# Patient Record
Sex: Female | Born: 1935 | Race: White | Hispanic: No | Smoking: Never smoker
Health system: Southern US, Community
[De-identification: ages and names within clinical notes are randomized; demographics above are authoritative.]

## PROBLEM LIST (undated history)

## (undated) DIAGNOSIS — G8929 Other chronic pain: Secondary | ICD-10-CM

## (undated) DIAGNOSIS — N390 Urinary tract infection, site not specified: Secondary | ICD-10-CM

## (undated) DIAGNOSIS — G4700 Insomnia, unspecified: Secondary | ICD-10-CM

## (undated) DIAGNOSIS — M8580 Other specified disorders of bone density and structure, unspecified site: Secondary | ICD-10-CM

## (undated) DIAGNOSIS — F0390 Unspecified dementia without behavioral disturbance: Secondary | ICD-10-CM

## (undated) DIAGNOSIS — I10 Essential (primary) hypertension: Secondary | ICD-10-CM

## (undated) DIAGNOSIS — G629 Polyneuropathy, unspecified: Secondary | ICD-10-CM

## (undated) DIAGNOSIS — E039 Hypothyroidism, unspecified: Secondary | ICD-10-CM

## (undated) HISTORY — PX: KNEE SURGERY: SHX244

## (undated) HISTORY — DX: Other specified disorders of bone density and structure, unspecified site: M85.80

## (undated) HISTORY — PX: TOTAL HIP ARTHROPLASTY: SHX124

## (undated) HISTORY — DX: Urinary tract infection, site not specified: N39.0

## (undated) HISTORY — DX: Insomnia, unspecified: G47.00

## (undated) HISTORY — DX: Hypothyroidism, unspecified: E03.9

## (undated) HISTORY — DX: Polyneuropathy, unspecified: G62.9

## (undated) HISTORY — PX: APPENDECTOMY: SHX54

## (undated) HISTORY — DX: Essential (primary) hypertension: I10

## (undated) HISTORY — DX: Unspecified dementia, unspecified severity, without behavioral disturbance, psychotic disturbance, mood disturbance, and anxiety: F03.90

---

## 1998-06-07 ENCOUNTER — Ambulatory Visit (HOSPITAL_COMMUNITY): Admission: RE | Admit: 1998-06-07 | Discharge: 1998-06-07 | Payer: Self-pay | Admitting: Obstetrics and Gynecology

## 1999-09-26 ENCOUNTER — Other Ambulatory Visit: Admission: RE | Admit: 1999-09-26 | Discharge: 1999-09-26 | Payer: Self-pay | Admitting: Obstetrics and Gynecology

## 1999-11-14 ENCOUNTER — Encounter: Payer: Self-pay | Admitting: Cardiology

## 1999-11-14 ENCOUNTER — Encounter: Admission: RE | Admit: 1999-11-14 | Discharge: 1999-11-14 | Payer: Self-pay | Admitting: Cardiology

## 1999-11-17 ENCOUNTER — Ambulatory Visit (HOSPITAL_COMMUNITY): Admission: RE | Admit: 1999-11-17 | Discharge: 1999-11-17 | Payer: Self-pay | Admitting: Cardiology

## 1999-11-17 ENCOUNTER — Encounter: Payer: Self-pay | Admitting: Cardiology

## 2000-09-30 ENCOUNTER — Other Ambulatory Visit: Admission: RE | Admit: 2000-09-30 | Discharge: 2000-09-30 | Payer: Self-pay | Admitting: Obstetrics and Gynecology

## 2001-05-23 ENCOUNTER — Ambulatory Visit (HOSPITAL_COMMUNITY): Admission: RE | Admit: 2001-05-23 | Discharge: 2001-05-23 | Payer: Self-pay | Admitting: Gastroenterology

## 2001-08-18 ENCOUNTER — Encounter: Payer: Self-pay | Admitting: Cardiology

## 2001-08-18 ENCOUNTER — Inpatient Hospital Stay (HOSPITAL_COMMUNITY): Admission: EM | Admit: 2001-08-18 | Discharge: 2001-08-21 | Payer: Self-pay | Admitting: Emergency Medicine

## 2001-08-20 ENCOUNTER — Encounter: Payer: Self-pay | Admitting: Cardiology

## 2001-10-08 ENCOUNTER — Other Ambulatory Visit: Admission: RE | Admit: 2001-10-08 | Discharge: 2001-10-08 | Payer: Self-pay | Admitting: Obstetrics and Gynecology

## 2002-10-02 ENCOUNTER — Ambulatory Visit (HOSPITAL_COMMUNITY): Admission: RE | Admit: 2002-10-02 | Discharge: 2002-10-02 | Payer: Self-pay | Admitting: Cardiology

## 2002-10-02 ENCOUNTER — Encounter: Payer: Self-pay | Admitting: Cardiology

## 2003-11-03 ENCOUNTER — Other Ambulatory Visit: Admission: RE | Admit: 2003-11-03 | Discharge: 2003-11-03 | Payer: Self-pay | Admitting: Gynecology

## 2005-01-29 ENCOUNTER — Encounter: Admission: RE | Admit: 2005-01-29 | Discharge: 2005-02-21 | Payer: Self-pay | Admitting: Family Medicine

## 2005-11-05 ENCOUNTER — Other Ambulatory Visit: Admission: RE | Admit: 2005-11-05 | Discharge: 2005-11-05 | Payer: Self-pay | Admitting: Gynecology

## 2010-02-24 ENCOUNTER — Inpatient Hospital Stay (HOSPITAL_COMMUNITY)
Admission: EM | Admit: 2010-02-24 | Discharge: 2010-03-01 | Payer: Self-pay | Source: Home / Self Care | Admitting: Emergency Medicine

## 2010-11-20 ENCOUNTER — Inpatient Hospital Stay (HOSPITAL_COMMUNITY)
Admission: EM | Admit: 2010-11-20 | Discharge: 2010-11-23 | DRG: 315 | Disposition: A | Discharge status: Home / Self Care | Payer: Medicare Other | Attending: Family Medicine | Admitting: Family Medicine

## 2010-11-20 DIAGNOSIS — I129 Hypertensive chronic kidney disease with stage 1 through stage 4 chronic kidney disease, or unspecified chronic kidney disease: Secondary | ICD-10-CM | POA: Diagnosis present

## 2010-11-20 DIAGNOSIS — N183 Chronic kidney disease, stage 3 unspecified: Secondary | ICD-10-CM | POA: Diagnosis present

## 2010-11-20 DIAGNOSIS — N179 Acute kidney failure, unspecified: Secondary | ICD-10-CM | POA: Diagnosis present

## 2010-11-20 DIAGNOSIS — Z87440 Personal history of urinary (tract) infections: Secondary | ICD-10-CM

## 2010-11-20 DIAGNOSIS — I472 Ventricular tachycardia, unspecified: Secondary | ICD-10-CM | POA: Diagnosis present

## 2010-11-20 DIAGNOSIS — Y92009 Unspecified place in unspecified non-institutional (private) residence as the place of occurrence of the external cause: Secondary | ICD-10-CM

## 2010-11-20 DIAGNOSIS — I498 Other specified cardiac arrhythmias: Secondary | ICD-10-CM | POA: Diagnosis present

## 2010-11-20 DIAGNOSIS — G589 Mononeuropathy, unspecified: Secondary | ICD-10-CM | POA: Diagnosis present

## 2010-11-20 DIAGNOSIS — I959 Hypotension, unspecified: Principal | ICD-10-CM | POA: Diagnosis present

## 2010-11-20 DIAGNOSIS — W19XXXA Unspecified fall, initial encounter: Secondary | ICD-10-CM | POA: Diagnosis present

## 2010-11-20 DIAGNOSIS — E039 Hypothyroidism, unspecified: Secondary | ICD-10-CM | POA: Diagnosis present

## 2010-11-20 DIAGNOSIS — I4729 Other ventricular tachycardia: Secondary | ICD-10-CM | POA: Diagnosis present

## 2010-11-20 DIAGNOSIS — Z96659 Presence of unspecified artificial knee joint: Secondary | ICD-10-CM

## 2010-11-20 DIAGNOSIS — T502X5A Adverse effect of carbonic-anhydrase inhibitors, benzothiadiazides and other diuretics, initial encounter: Secondary | ICD-10-CM | POA: Diagnosis present

## 2010-11-20 DIAGNOSIS — F0390 Unspecified dementia without behavioral disturbance: Secondary | ICD-10-CM | POA: Diagnosis present

## 2010-11-20 DIAGNOSIS — D72829 Elevated white blood cell count, unspecified: Secondary | ICD-10-CM | POA: Diagnosis present

## 2010-11-21 LAB — COMPREHENSIVE METABOLIC PANEL
Alkaline Phosphatase: 42 U/L (ref 39–117)
CO2: 28 mEq/L (ref 19–32)
Creatinine, Ser: 1.11 mg/dL (ref 0.4–1.2)
Glucose, Bld: 94 mg/dL (ref 70–99)
Potassium: 3.9 mEq/L (ref 3.5–5.1)
Total Bilirubin: 0.7 mg/dL (ref 0.3–1.2)

## 2010-11-21 LAB — CBC
HCT: 29.8 % — ABNORMAL LOW (ref 36.0–46.0)
Hemoglobin: 9.7 g/dL — ABNORMAL LOW (ref 12.0–15.0)
MCHC: 32.6 g/dL (ref 30.0–36.0)
Platelets: 238 10*3/uL (ref 150–400)
RBC: 3.32 MIL/uL — ABNORMAL LOW (ref 3.87–5.11)

## 2010-11-22 DIAGNOSIS — I471 Supraventricular tachycardia: Secondary | ICD-10-CM

## 2010-11-22 LAB — BASIC METABOLIC PANEL
BUN: 17 mg/dL (ref 6–23)
Chloride: 108 mEq/L (ref 96–112)
GFR calc Af Amer: >60 mL/min (ref >60)
GFR calc non Af Amer: 58 mL/min — ABNORMAL LOW (ref >60)
Potassium: 3.6 mEq/L (ref 3.5–5.1)
Sodium: 142 mEq/L (ref 135–145)

## 2010-11-22 LAB — CBC
MCV: 88.6 fL (ref 78.0–100.0)
Platelets: 236 10*3/uL (ref 150–400)
RBC: 3.41 MIL/uL — ABNORMAL LOW (ref 3.87–5.11)
RDW: 13 % (ref 11.5–15.5)
WBC: 8.1 10*3/uL (ref 4.0–10.5)

## 2010-11-22 LAB — MAGNESIUM: Magnesium: 2 mg/dL (ref 1.5–2.5)

## 2010-11-22 LAB — VITAMIN B12: Vitamin B-12: 300 pg/mL (ref 211–911)

## 2010-11-22 LAB — RPR: RPR Ser Ql: NONREACTIVE

## 2010-11-22 NOTE — H&P (Signed)
NAMEBONNETTA, ALLBEE              ACCOUNT NO.:  [id number]  MEDICAL RECORD NO.:  [id number]          PATIENT TYPE:  INP  LOCATION:  4729                         FACILITY:  [facility]  PHYSICIAN:  Hillery Aldo, M.D.   DATE OF BIRTH:  Oct 13, 1924  DATE OF ADMISSION:  11/20/2010 DATE OF DISCHARGE:                             HISTORY & PHYSICAL   PRIMARY CARE PHYSICIAN:  Ernestina Penna, MD  CHIEF COMPLAINT:  Frequent falling.  HISTORY OF PRESENT ILLNESS:  The patient is an 77 year old female with past medical history of dementia as well as bilateral foot neuropathy, who presents with having had several falls at home over the past 3 days. The patient had 2 falls today, prompting her nephew to call EMS.  The patient states that her falls are not preceded by any dizziness and there is no loss of consciousness.  She merely gets weak and falls on the floor.  The patient's nephew states that he noticed that she was started on a new blood pressure medicine this morning, although the blood pressure medicines are the same, she is no longer on a combination pill.  The patient denies any fever, chills, dyspnea, chest pain, or dysuria.  Upon initial evaluation in the emergency department, the patient was found to be hypotensive with a diastolic blood pressure of 30.  She subsequently has been referred to the hospitalist service for inpatient evaluation.  PAST MEDICAL HISTORY: 1. Hypertension. 2. Peripheral neuropathy of the lower extremities. 3. History of supraventricular tachycardia. 4. Vertigo. 5. Hypothyroidism. 6. Dementia. 7. Stage III chronic kidney disease with a baseline creatinine of     1.05.  PAST SURGICAL HISTORY: 1. Left total knee replacement. 2. Appendectomy. 3. Left hip fracture repair.  FAMILY HISTORY:  The patient's father died at a young age from unknown causes.  The patient's mother died at age 61.  She had a heart disease and cerebrovascular disease.  She has 1  sister who is alive and suffers with glaucoma, hypertension, and heart disease.  She has 2 healthy offspring.  SOCIAL HISTORY:  The patient is widowed and her nephew currently lives with her and helps her with her ADLs.  She is a lifelong nonsmoker.  She denies alcohol or drug use.  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  ALLERGIES:  No known drug allergies.  CURRENT MEDICATIONS: 1. Hydrochlorothiazide 25 mg p.o. daily. 2. Potassium chloride 20 mEq p.o. daily. 3. Synthroid 25 mcg p.o. daily. 4. Ambien 5 mg p.o. nightly p.r.n. sleep. 5. Atenolol 25 mg p.o. daily. 6. Benazepril 10 mg p.o. q.a.m. and 20 mg p.o. nightly. 7. APAP/codeine 300/30 one tablet p.o. q.6 h p.r.n. pain. 8. Gabapentin 900 mg p.o. nightly.  REVIEW OF SYSTEMS:  Comprehensive 14-point review of systems was completed and the pertinents are mentioned in the elements of the HPI above.  Pertinent negatives are no headache, no focal neurologic deficits.  Her appetite has been normal.  There has not been any weight loss. PHYSICAL EXAMINATION:  VITAL SIGNS:  Temperature 97.9, pulse 59, respirations 16, blood pressure 93/30, O2 saturation  95% on room air. GENERAL:  Elderly frail female who is in no acute distress. HEENT:  Normocephalic, atraumatic.  PERRL.  EOMI.  Oropharynx is clear. NECK:  Supple, no thyromegaly, no lymphadenopathy, no jugular venous distention. CHEST:  Lungs are clear to auscultation bilaterally with good air movement. HEART:  Regular rate, rhythm.  No murmurs, rubs, or gallops. ABDOMEN:  Soft, nontender, nondistended with normoactive bowel sounds. EXTREMITIES:  A 2+ edema bilaterally. SKIN:  Warm and dry.  No rashes. NEUROLOGIC:  The patient is alert and oriented x2.  Cranial nerves II through XII are grossly intact.  Nonfocal.  DATA REVIEW:  CT scan of the head shows atrophy and chronic microvascular ischemia, but no acute changes.  Chest  x-ray shows mild cardiac enlargement and mild chronic bronchitic- type changes, but no acute pulmonary findings.  LABORATORY DATA:  Urine drug screen is positive for opiates.  Lactic acid is 1.2.  Sodium is 138, potassium 4.2, chloride 100, bicarb 29, BUN 29, creatinine 1.25, glucose 89, calcium 8.7.  PTT is 38, PT 15.6. Alcohol is less than 5.  White blood cell count is 14.4, hemoglobin 12, hematocrit 37.6, platelets 280.  Urinalysis is negative for nitrites, rare bacteria, with 11-20 white blood cells, and many squamous epithelial cells.  ASSESSMENT AND PLAN: 1. Falls with weakness:  The patient's falls and weakness are likely     secondary to hypotension.  We will admit her, gently hydrate her,     and check orthostatics upon admission and q.a.m. x3 sets.  We will     hold her blood pressure medicines for any systolic blood pressure     less than 110 and completely discontinue hydrochlorothiazide at     this time. 2. History of urinary tract infections:  The patient is not currently     complaining of any dysuria, and although she has pyuria, her     nitrites were negative and there were many squamous cells in the     sample suggesting contamination.  At this point, would hold     antibiotics unless her urine cultures are positive. 3. Acute renal failure in setting of stage III chronic kidney disease:     The patient's baseline creatinine is 1.05 and her current     creatinine is approximately 20% higher indicating acute renal     failure.  We will gently hydrate the patient and hold her diuretic. 4. Bilateral foot neuropathy:  Likely contributory to the patient's     falling.  We will continue gabapentin and get a PT and OT     evaluation. 5. Hypothyroidism:  Check the patient's TSH.  We will continue her     usual dose of Synthroid. 6. Prophylaxis:  Initiate Lovenox for DVT prophylaxis.  Time spent on admission including face-to-face time equals approximately 1  hour.     Hillery Aldo, M.D.     CR/MEDQ  D:  11/20/2010  T:  11/21/2010  Job:  [id number]  cc:   Ernestina Penna, M.D.  Electronically Signed by Hillery Aldo M.D. on 11/22/2010 12:49:26 PM

## 2010-11-23 DIAGNOSIS — I472 Ventricular tachycardia: Secondary | ICD-10-CM

## 2010-11-23 LAB — FOLATE RBC: RBC Folate: 1015 ng/mL — ABNORMAL HIGH (ref 180–600)

## 2010-11-23 LAB — CBC
Platelets: 289 10*3/uL (ref 150–400)
RBC: 3.72 MIL/uL — ABNORMAL LOW (ref 3.87–5.11)
RDW: 12.6 % (ref 11.5–15.5)
WBC: 9.4 10*3/uL (ref 4.0–10.5)

## 2010-11-23 LAB — BASIC METABOLIC PANEL
BUN: 12 mg/dL (ref 6–23)
Chloride: 101 mEq/L (ref 96–112)
GFR calc Af Amer: >60 mL/min (ref >60)
GFR calc non Af Amer: >60 mL/min (ref >60)
Potassium: 3.5 mEq/L (ref 3.5–5.1)
Sodium: 137 mEq/L (ref 135–145)

## 2010-12-04 NOTE — Discharge Summary (Signed)
Casey Vasquez, Casey Vasquez              ACCOUNT NO.:  [id number]  MEDICAL RECORD NO.:  [id number]           PATIENT TYPE:  I  LOCATION:  4734                         FACILITY:  [facility]  PHYSICIAN:  Mauro Kaufmann, MD         DATE OF BIRTH:  11-17-1923  DATE OF ADMISSION:  11/20/2010 DATE OF DISCHARGE:  11/23/2010                              DISCHARGE SUMMARY   ADMISSION DIAGNOSES: 1. Fall with weakness. 2. History of urinary tract infections. 3. Acute renal failure. 4. Bilateral foot neuropathy. 5. Hypothyroidism.  DISCHARGE DIAGNOSES: 1. Fall secondary to hypotension with diuretics, off diuretics at this     time. 2. Acute renal failure resolved. 3. Neuropathy. 4. Hypothyroidism.  TESTS PERFORMED DURING THE HOSPITAL STAY:  Chest x-ray on November 20, 2010, showed mild cardiac enlargement, mild chronic bronchitic changes, but no acute pulmonary findings.  CT head without contrast on November 20, 2010, showed atrophy and chronic microvascular ischemia.  No acute intracranial abnormality.  PERTINENT LABORATORY DATA:  Magnesium level is 2.0.  RPR nonreactive. Urine culture showed no growth.  TSH 2.115, T4 1.11.  Urine culture showed no growth.  Vitamin B12 was 300.  BRIEF HISTORY AND PHYSICAL:  An 77 year old Caucasian female with no known cardiac history who underwent cardiac cath in 2001 with normal coronaries, normal left ventricular ejection fraction, has a history of hypothyroidism, dementia, vertigo, who came to the hospital with several falls at home over the past 3 days.  The patient states that falls were not preceded with any dizziness, and there was no loss of consciousness, and the patient was admitted with diagnosis of fall with weakness, and she was found to be hypotensive, and the hydrochlorothiazide was stopped at the time of admission.  BRIEF HOSPITAL COURSE: 1. Fall with weakness.  The patient was thought to be having the falls     due to the diuretics.  She is off  diuretics at this time.  Also,     Cardiology saw the patient in the hospital and as said above, a 48-     hour Holter monitoring as outpatient at the time of discharge.  The     patient also will be getting home health, PT, OT for strengthening     at home. 2. Acute renal failure.  The patient at the time of admission was     acute renal failure with her creatinine was 1.25 at this time.  The     patient has improved as BUN and creatinine is down to 12.12/0.86. 3. Leukocytosis, which is resolved at this time.  This could be due to     stress, but at this time the patient has been afebrile in the     hospital and with negative urine cultures.  On the day of     discharge, the patient with WBC 9.4, hemoglobin 10.7, hematocrit     32.8.  Her vital signs on the day of discharge temperature 98,     pulse 50, respirations 18, blood pressure 136/74, O2 sat 96% on     room air. 4. Hypothyroidism.  The patient will  continue to take Synthroid. 5. Nonsustained V-tach.  The patient had 7 beats of nonsustained V-     tach in the hospital, and she also has bradycardia.  Cardiology has     seen in consultation.  The patient does have a history of     supraventricular tachycardia in the past.  The patient will get     outpatient 48-hour Holter monitoring as per Cardiology as     outpatient.  At this time, the patient is stable for discharge. 6. Hypertension.  The patient's blood pressure is stable.  She is off     the diuretics, and we have started the patient back on benazepril     at a lower dose, and she will continue on atenolol.  MEDICATIONS ON DISCHARGE: 1. Tylenol 650 p.o. 4 hours as needed. 2. Atenolol 25 mg p.o. daily. 3. Benazepril 20 mg p.o. daily at bedtime. 4. Gabapentin 300 mg 3 capsules daily at bedtime. 5. Levothyroxine 25 mcg 1 tablet p.o. daily. 6. Zolpidem 5 mg 1 p.o. daily at bedtime.     Mauro Kaufmann, MD     GL/MEDQ  D:  11/23/2010  T:  11/24/2010  Job:  [id number]  cc:    Ernestina Penna, M.D. Rollene Rotunda, MD, [facility]  Electronically Signed by Mauro Kaufmann  on 12/04/2010 10:38:11 AM

## 2011-01-04 NOTE — Consult Note (Signed)
Casey Vasquez, MESSENGER NO.:  [id number]  MEDICAL RECORD NO.:  [id number]           PATIENT TYPE:  LOCATION:                                 FACILITY:  PHYSICIAN:  Rollene Rotunda, MD, FACCDATE OF BIRTH:  11/15/1923  DATE OF CONSULTATION:  11/22/2010 DATE OF DISCHARGE:                                CONSULTATION   PRIMARY CARE PHYSICIAN:  Ernestina Penna, MD  She is new to Cardiology.  REASON FOR CONSULTATION:  Seven beats of nonsustained ventricular tachycardia.  HISTORY OF PRESENT ILLNESS:  Ms. Casey Vasquez is an 77 year old Caucasian female with no known cardiac history having undergone cardiac catheterization in 2001 with normal coronary arteries and normal left ventricular ejection fraction of 60%, history significant for hypertension (although hypotensive on admission), hypothyroidism, vertigo, dementia, and history of SVT who presents with seven beats of nonsustained ventricular tachycardia in the setting of an admission for falls, found to be significantly hypotensive on admission with blood pressure of 83/36 in the emergency department.  The patient was in her usual state of health until recently but is not exactly sure when her symptoms started, but she was complaining of weakness and had some falls at home where she lives with her nephew. The patient denies pre/syncope, palpitations, chest pain, shortness of breath, nausea, vomiting, or any other complaints other than weakness. She does seem a bit confused and it is unclear whether or not she can give an accurate history, but she is adamant that she did not have any chest pain, shortness of breath, pre/syncope, or palpitations.  She does report falls, but she is unsure of exactly when they started or their character.  For example, when asked to describe her falls, she states she simply falls and she cannot differentiate between a fast fall that she has no control over versus a slow fall where she is  bracing herself. Again, the patient is sure she did not lose consciousness.  At any rate, she lives with her nephew who became concerned about her and brought her to the emergency department, where initial blood pressure reading was 83/36.  She was subsequently admitted by the hospitalist service and treated with IV fluids.  She has had orthostatics checked twice, both sets were negative.  On telemetry, she is normally in sinus bradycardia with rates in the high 40s to 50s and she had one run of seven beats of nonsustained VT followed by either junctional or sinus beat then followed by a different morphology of likely ventricular origin.  She has not had any other runs of NSVT.  Nor does she have frequent ventricular ectopy on telemetry.  Labs unremarkable including TSH within normal limits, electrolytes within normal limits, and potassium 3.6-4.2, and magnesium of 2.0.  She has mild anemia but no other significant findings.  Chest x-ray and CT of the head showed some chronic changes but nothing acute.  EKG shows sinus bradycardia at a rate of 58 bpm but no other abnormalities.  When compared to prior tracing, no changes.  Currently, the patient is asymptomatic other than minimal chest discomfort that has been nagging her all day  that she is unconcerned about.  She denies any association with exertion or shortness of breath. She is great looking forward to going home which she anticipates to be tomorrow.  Of note, the patient is on antihypertensive therapy including atenolol 25 mg daily, Lotensin 20 mg daily, and at home was on chlorthalidone 12.5 mg p.o. daily which was stopped prior to admission and she was treated with IV fluids.  Since then, her blood pressure has come up and systolic has been anywhere from 113-174.  PAST MEDICAL HISTORY: 1. Hypertension (hypotensive on admission, 83/36). 2. Hypothyroidism. 3. Vertigo. 4. CKD, stage III. 5. Dementia. 6. History of SVT. 7.  Peripheral neuropathy.  PAST SURGICAL HISTORY: 1. S/P total left knee arthroplasty. 2. ORIF left hip. 3. Appendectomy.  SOCIAL HISTORY:  [place]  She lives with her nephew on what she describes as a farm. She is retired.  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  She has never been a smoker nor does she use any alcohol or illicit drugs.  No herbal meds.  She has a regular diet, is active and completing most of her own daily activities, but no regular exercise and must pace herself in regard to those daily activities.  FAMILY HISTORY:  Noncontributory secondary to the patient's advanced age.  REVIEW OF SYSTEMS:  Please see HPI.  All other systems reviewed and were negative.  CODE STATUS:  Full.  ALLERGIES:  No known drug allergies.  MEDICATIONS: 1. Atenolol 25 mg p.o. daily. 2. Lotensin 20 mg p.o. daily. 3. Lovenox 40 mg subcu injection daily. 4. Gabapentin 900 mg p.o. nightly. 5. Synthroid 25 mcg p.o. daily. 6. MiraLax. 7. P.r.n. medications.  Home Medications:  At home, the patient was on above along with chlorthalidone 12.5 mg p.o. daily and APAP with codeine.  PHYSICAL EXAMINATION:  VITAL SIGNS:  Temperature 97.3 degrees Fahrenheit with BP ranging from 113-174 over 69-  82, pulse ranging from high 40s to 60s, respiration rate 16-18, O2 saturation 98% on room air. TELEMETRY:  Sinus bradycardia in the 40s to 50s along with normal sinus rhythm in the 60s with seven beats of NSVT, very infrequent ventricular ectopy. ORTHOSTATICS:  Negative x2. GENERAL:  The patient is alert and oriented x1 (person), in no apparent distress, able to speak very easily in full sentences as well as move during exam without any respiratory distress. HEENT:  Her head is normocephalic, atraumatic.  Pupils equal, round, and reactive to light.  Extraocular muscles are intact.  Nares are patent without discharge.  Oropharynx without  erythema or exudate. NECK:  Supple without lymphadenopathy.  No thyromegaly, no JVD.  No bruits. HEART:  Rate is regular and brady in the 50s with audible S1 and S2.  A 1/6 systolic murmur best heard at the left upper sternal border.  Pulses are 2+ in both upper and lower extremities bilaterally. LUNGS:  Mild crackles half way up, otherwise, no significant findings. SKIN:  No rashes, lesions, or petechiae. ABDOMEN:  Soft, nontender, nondistended.  Normal abdominal bowel sounds. No rebound or guarding.  No hepatosplenomegaly. EXTREMITIES:  No clubbing or cyanosis.  Trace edema most bilaterally. MUSCULOSKELETAL:  Without joint deformity or effusions.  No spinal or CVA tenderness. NEUROLOGIC:  Cranial nerves II through XII grossly intact.  Strength 5/5 in all extremities and axial groups.  Normal sensation throughout and normal cerebellar function.  RADIOLOGY:  Chest x-ray showed mild cardiac enlargement with mild bronchitic changes. CT of  the head showed atrophy and chronic microvascular ischemia.  No acute findings. EKG; rhythm is sinus bradycardia at a rate of 58, no significant ST-T wave changes but early R-wave progression, no significant Q-waves, normal axis, no evidence of hypertrophy, PR 160, QRS 86, and QTC 420. When compared to a prior tracing from May 2011, there are no significant changes.  LABORATORY DATA:  WBC is 8.1, HGB 9.9, HCT 30.2, PLT count is 236. Sodium 142; potassium 3.6, down from 4.2 on admission; chloride 108; bicarb 26; BUN 17; creatinine 0.92; glucose 87.  Liver function tests within normal limits.  TSH within normal limits at 1.11, also T4 within normal limits.  Magnesium 2.0.  Urinalysis; hazy with many squamous epithelial cells and a small amount of leukocytes.  The culture is negative.  RPR nonreactive.  ASSESSMENT AND PLAN:  The patient was initially seen by Jarrett Ables, PA-C, and then seen and thoroughly assessed by attending cardiologist, Dr.  Rollene Rotunda.  Ms. Rollyson is an 77 year old Caucasian female with no known history of coronary artery disease with a normal cardiac catheterization in 2001, history significant for hypertension (hypotensive, 83/36, on admission), hypothyroidism, vertigo, dementia, history of supraventricular tachycardia who now presents with a seven-beat run of nonsustained ventricular tachycardia in the setting of admission for falls and subsequently noted to be hypotensive as above denying any pre/syncope, chest pain, shortness of breath, and no evidence of ischemia identified thus far.  The patient had one episode of wide-complex tachycardia.  No associated symptoms.  She has also had some bradycardia, again no associated symptoms.  She presented with falls but no syncope.  She does not convey any details in regard to the events leading to her evaluation in the emergency department.  She does have chest pain but cannot qualify or quantify.  Wide-complex tachycardia - one episode.  No symptoms.  I will order an outpatient 48-hour Holter monitor.  No other workup at the present time.  Thank you for the consult.     Jarrett Ables, PAC   ______________________________ Rollene Rotunda, MD, [facility]    MS/MEDQ  D:  11/22/2010  T:  11/23/2010  Job:  [id number]  Electronically Signed by Jarrett Ables PAC on 11/29/2010 03:03:09 PM Electronically Signed by Rollene Rotunda MD [facility] on 01/04/2011 09:52:05 AM

## 2011-01-09 LAB — DIFFERENTIAL
Basophils Absolute: 0 10*3/uL (ref 0.0–0.1)
Eosinophils Relative: 0 % (ref 0–5)
Eosinophils Relative: 0 % (ref 0–5)
Lymphocytes Relative: 13 % (ref 12–46)
Lymphocytes Relative: 7 % — ABNORMAL LOW (ref 12–46)
Lymphocytes Relative: 8 % — ABNORMAL LOW (ref 12–46)
Lymphs Abs: 1.9 10*3/uL (ref 0.7–4.0)
Monocytes Absolute: 1.6 10*3/uL — ABNORMAL HIGH (ref 0.1–1.0)
Monocytes Relative: 4 % (ref 3–12)
Monocytes Relative: 5 % (ref 3–12)
Monocytes Relative: 5 % (ref 3–12)
Neutro Abs: 12.3 10*3/uL — ABNORMAL HIGH (ref 1.7–7.7)
Neutrophils Relative %: 82 % — ABNORMAL HIGH (ref 43–77)
Neutrophils Relative %: 87 % — ABNORMAL HIGH (ref 43–77)

## 2011-01-09 LAB — URINE MICROSCOPIC-ADD ON

## 2011-01-09 LAB — PROTIME-INR
INR: 1.35 (ref 0.00–1.49)
INR: 1.44 (ref 0.00–1.49)
INR: 1.65 — ABNORMAL HIGH (ref 0.00–1.49)
INR: 1.89 — ABNORMAL HIGH (ref 0.00–1.49)
INR: 2.3 — ABNORMAL HIGH (ref 0.00–1.49)
Prothrombin Time: 16.6 seconds — ABNORMAL HIGH (ref 11.6–15.2)
Prothrombin Time: 25.1 seconds — ABNORMAL HIGH (ref 11.6–15.2)

## 2011-01-09 LAB — PREPARE RBC (CROSSMATCH)

## 2011-01-09 LAB — COMPREHENSIVE METABOLIC PANEL
ALT: 36 U/L — ABNORMAL HIGH (ref 0–35)
AST: 78 U/L — ABNORMAL HIGH (ref 0–37)
Calcium: 8.7 mg/dL (ref 8.4–10.5)
Creatinine, Ser: 2.06 mg/dL — ABNORMAL HIGH (ref 0.4–1.2)
GFR calc Af Amer: 28 mL/min — ABNORMAL LOW (ref >60)
Sodium: 141 mEq/L (ref 135–145)
Total Protein: 6 g/dL (ref 6.0–8.3)

## 2011-01-09 LAB — URINALYSIS, ROUTINE W REFLEX MICROSCOPIC
Nitrite: NEGATIVE
Nitrite: NEGATIVE
Specific Gravity, Urine: 1.02 (ref 1.005–1.030)
Specific Gravity, Urine: 1.021 (ref 1.005–1.030)
Urobilinogen, UA: 0.2 mg/dL (ref 0.0–1.0)
Urobilinogen, UA: 1 mg/dL (ref 0.0–1.0)
pH: 5.5 (ref 5.0–8.0)

## 2011-01-09 LAB — BASIC METABOLIC PANEL
BUN: 48 mg/dL — ABNORMAL HIGH (ref 6–23)
BUN: 50 mg/dL — ABNORMAL HIGH (ref 6–23)
CO2: 26 mEq/L (ref 19–32)
CO2: 27 mEq/L (ref 19–32)
Calcium: 7.3 mg/dL — ABNORMAL LOW (ref 8.4–10.5)
Calcium: 8.1 mg/dL — ABNORMAL LOW (ref 8.4–10.5)
Chloride: 107 mEq/L (ref 96–112)
Chloride: 107 mEq/L (ref 96–112)
Creatinine, Ser: 1.81 mg/dL — ABNORMAL HIGH (ref 0.4–1.2)
Creatinine, Ser: 2.01 mg/dL — ABNORMAL HIGH (ref 0.4–1.2)
GFR calc Af Amer: 29 mL/min — ABNORMAL LOW (ref >60)
GFR calc Af Amer: 32 mL/min — ABNORMAL LOW (ref >60)
GFR calc Af Amer: >60 mL/min (ref >60)
GFR calc non Af Amer: 27 mL/min — ABNORMAL LOW (ref >60)
GFR calc non Af Amer: 36 mL/min — ABNORMAL LOW (ref >60)
Potassium: 3.3 mEq/L — ABNORMAL LOW (ref 3.5–5.1)
Potassium: 4.3 mEq/L (ref 3.5–5.1)
Sodium: 142 mEq/L (ref 135–145)
Sodium: 142 mEq/L (ref 135–145)

## 2011-01-09 LAB — TSH: TSH: 2.785 u[IU]/mL (ref 0.350–4.500)

## 2011-01-09 LAB — TYPE AND SCREEN
ABO/RH(D): O NEG
Antibody Screen: NEGATIVE

## 2011-01-09 LAB — CBC
HCT: 28.6 % — ABNORMAL LOW (ref 36.0–46.0)
HCT: 30.3 % — ABNORMAL LOW (ref 36.0–46.0)
HCT: 32.5 % — ABNORMAL LOW (ref 36.0–46.0)
Hemoglobin: 10.1 g/dL — ABNORMAL LOW (ref 12.0–15.0)
Hemoglobin: 10.5 g/dL — ABNORMAL LOW (ref 12.0–15.0)
Hemoglobin: 11.4 g/dL — ABNORMAL LOW (ref 12.0–15.0)
MCHC: 34.3 g/dL (ref 30.0–36.0)
MCHC: 34.9 g/dL (ref 30.0–36.0)
MCHC: 34.9 g/dL (ref 30.0–36.0)
MCV: 88.8 fL (ref 78.0–100.0)
Platelets: 146 10*3/uL — ABNORMAL LOW (ref 150–400)
RBC: 2.81 MIL/uL — ABNORMAL LOW (ref 3.87–5.11)
RBC: 3.28 MIL/uL — ABNORMAL LOW (ref 3.87–5.11)
RBC: 3.42 MIL/uL — ABNORMAL LOW (ref 3.87–5.11)
RBC: 3.7 MIL/uL — ABNORMAL LOW (ref 3.87–5.11)
RDW: 13.6 % (ref 11.5–15.5)
WBC: 10 10*3/uL (ref 4.0–10.5)
WBC: 10.6 10*3/uL — ABNORMAL HIGH (ref 4.0–10.5)
WBC: 14.9 10*3/uL — ABNORMAL HIGH (ref 4.0–10.5)

## 2011-01-09 LAB — CK TOTAL AND CKMB (NOT AT ARMC)
CK, MB: 73.3 ng/mL (ref 0.3–4.0)
Relative Index: 2.7 — ABNORMAL HIGH (ref 0.0–2.5)

## 2011-01-09 LAB — URINE CULTURE: Colony Count: NO GROWTH

## 2011-01-09 LAB — LACTIC ACID, PLASMA: Lactic Acid, Venous: 2.1 mmol/L (ref 0.5–2.2)

## 2011-01-10 LAB — BASIC METABOLIC PANEL
BUN: 29 mg/dL — ABNORMAL HIGH (ref 6–23)
CO2: 29 mEq/L (ref 19–32)
Creatinine, Ser: 1.25 mg/dL — ABNORMAL HIGH (ref 0.4–1.2)
GFR calc Af Amer: 49 mL/min — ABNORMAL LOW (ref >60)
GFR calc non Af Amer: 41 mL/min — ABNORMAL LOW (ref >60)
Glucose, Bld: 89 mg/dL (ref 70–99)
Potassium: 4.2 mEq/L (ref 3.5–5.1)

## 2011-01-10 LAB — ETHANOL: Alcohol, Ethyl (B): <5 mg/dL (ref 0–10)

## 2011-01-10 LAB — URINE MICROSCOPIC-ADD ON

## 2011-01-10 LAB — CBC
HCT: 37.6 % (ref 36.0–46.0)
MCHC: 31.9 g/dL (ref 30.0–36.0)
Platelets: 280 10*3/uL (ref 150–400)

## 2011-01-10 LAB — URINALYSIS, ROUTINE W REFLEX MICROSCOPIC
Ketones, ur: NEGATIVE mg/dL
Nitrite: NEGATIVE
Specific Gravity, Urine: 1.016 (ref 1.005–1.030)
Urobilinogen, UA: 0.2 mg/dL (ref 0.0–1.0)

## 2011-01-10 LAB — PROTIME-INR: Prothrombin Time: 15.6 seconds — ABNORMAL HIGH (ref 11.6–15.2)

## 2011-01-10 LAB — DIFFERENTIAL
Lymphs Abs: 2.7 10*3/uL (ref 0.7–4.0)
Monocytes Absolute: 0.8 10*3/uL (ref 0.1–1.0)
Monocytes Relative: 5 % (ref 3–12)
Neutro Abs: 10.5 10*3/uL — ABNORMAL HIGH (ref 1.7–7.7)

## 2011-01-10 LAB — RAPID URINE DRUG SCREEN, HOSP PERFORMED
Benzodiazepines: NOT DETECTED
Cocaine: NOT DETECTED
Opiates: POSITIVE — AB
Tetrahydrocannabinol: NOT DETECTED

## 2011-01-10 LAB — URINE CULTURE: Colony Count: NO GROWTH

## 2011-01-10 LAB — APTT: aPTT: 38 seconds — ABNORMAL HIGH (ref 24–37)

## 2011-03-09 NOTE — Procedures (Signed)
[facility]  Patient:    Casey Vasquez, Casey Vasquez                     MRN: [id number] Proc. Date: 05/23/01 Adm. Date:  [id number] Disc. Date: [id number] Attending:  Nelda Marseille CC:         Gaynelle Cage, M.D., [place], [place]   Procedure Report  PROCEDURE:  Colonoscopy.  INDICATIONS:  Some lower abdominal discomfort in a patient due for colonic screening.  INFORMED CONSENT:  Consent was signed after risk, benefits, methods and options were thoroughly discussed in the office.  MEDICINES USED:  Demerol 50 mg, Versed 5 mg.  DESCRIPTION OF PROCEDURE:  Rectal inspection was pertinent for external hemorrhoids.  Digital exam was negative.  The video pediatric colonoscope was inserted and easily advanced around the colon to the cecum.  This did require some abdominal pressure but no position changes.  The cecum was identified by the appendiceal orifice and the ileocecal valve.  No obvious abnormality was seen on insertion.  The scope was slowly withdrawn.  Prep was adequate.  There was some liquid stool that required washing and suctioning but on slow withdrawal through the colon, other than some occasional left-sided diverticula, no abnormalities were seen.  Once back in the rectum the scope was retroflexed, pertinent for  some internal hemorrhoids.  The scope was straightened and readvanced a short ways up the sigmoid, air was suctioned and the scope removed.  The patient tolerated the procedure well, and there was no obvious immediate complication.  ENDOSCOPIC DIAGNOSES: 1. External and internal hemorrhoids. 2. Occasional left-sided diverticula. 3. Otherwise within normal limits to the cecum.  PLAN:  Follow up p.r.n. or in two months to recheck symptoms and make sure no further work-up plans are needed.  Repeat colonic screening in 5-10 years pending symptoms, other medical problems, etc. DD:  05/23/01 TD:  05/25/01 Job: [id number]

## 2011-03-09 NOTE — Cardiovascular Report (Signed)
[facility]  Patient:    Casey Vasquez                      MRN: [id number] Proc. Date: 11/17/99 Adm. Date:  [id number] Disc. Date: [id number] Attending:  Ophelia Vasquez CC:         Casey Vasquez, M.D.             Cardiac Catheterization Lab                        Cardiac Catheterization  PROCEDURES PERFORMED: 1. Selective coronary angiography by Judkins technique. 2. Retrograde left heart catheterization. 3. Left ventricular angiography.  COMPLICATIONS:  None.  ENTRY SITE:  Right femoral.  DYE USED:  Omnipaque.  MEDICATIONS GIVEN:  Fentanyl 25 mg IV for sedation, given twice.  PATIENT PROFILE:  The patient is a 77 year old obese female who is followed by r. Casey Vasquez in [place] who has a history of chest pain in the past and has recently had "misery" in her chest that feels like a pressure, and at times, she has some fast heartbeats.  They do not occur with exertion.  They may occur with rest. he has had them in the middle of the night when she gets up to go to the bathroom.  She is treated for hypertension and menopausal symptoms.  This cardiac catheterization was performed on an outpatient basis electively.  RESULTS:  Pressures:  The left ventricular pressure was 180/20.  Central aortic pressure 180/85.  Angiographic results: 1. The left main coronary artery was normal. 2. The left anterior descending coronary artery courses to the cardiac apex. It    gives rise to a small first diagonal and a larger second diagonal branch. The    LAD and both diagonals are free of any significant atherosclerotic narrowing. 3. The left circumflex coronary artery is nondominant.  The circumflex and an    obtuse marginal branch appear normal. 4. The large and dominant right coronary artery gives off a long atrial circumflex    branch distally.  No lesions are seen in the RCA or any of its branches.  The left ventricle  contracts normally.  Ejection fraction is 60%.  There is catheter-induced mitral regurgitation seen.  During the catheterization, the patient developed a transient episode of atrial  fibrillation lasting approximately 45 seconds, and then it spontaneously resolved.  FINAL DIAGNOSES: 1. Angiographically patent coronary arteries. 2. Normal left ventricular function. 3. A 45-second run of atrial fibrillation during cardiac catheterization, resolving    spontaneously.  PLAN:  Proton pump inhibitors will be initiated, and a GI consultation will be obtained to see if the patient has gastrointestinal pathology to explain her symptoms.  A Holter monitor is planned, and if the patient continues to have palpitation, PAF may be the explanation for it and will require treatment. DD:  11/17/99 TD:  11/18/99 Job: [id number]

## 2011-03-09 NOTE — Discharge Summary (Signed)
[place]  Patient:    Casey Vasquez, Casey Vasquez Visit Number: [id number] MRN: [id number]          Service Type: MED Location: [id number] Attending Physician:  Ophelia Shoulder Dictated by:   Darcella Gasman. Ingold, F.N.P.C. Admit Date:  08/18/2001 Discharge Date: 08/21/2001   CC:         Dr. Vernon Prey, [place], [place]  Santina Evans A. Orlin Hilding, M.D.   Discharge Summary  DISCHARGE DIAGNOSES: 1. Supraventricular tachycardia, resolved. 2. Bradycardia, improved. 3. Hypertension, improved. 4. Significant dizziness. 5. Hyperlipidemia. 6. Cardiac catheterization January 2001, with normal coronary arteries and    normal ejection fraction.  DISCHARGE CONDITION:  Improved.  PROCEDURES:  None.  CONSULTATIONS: 1. Candy Sledge, M.D. 2. Deanna Artis. Hickling, M.D.  DISCHARGE MEDICATIONS:  1. K-Dur 20 mEq one half tablet daily.  2. Tenoretic 50/25 one half tablet daily.  3. Neurontin 300 mg two daily.  4. Ogen 0.625 one daily.  5. Lotensin 10 mg every morning.  6. Lotensin 20 mg every evening, two of the 10 mg tablets.  7. Claritin 10 mg daily.  8. Provera take on days 1 through 10.  9. Librax 50 mg one daily as needed. 10. Pravachol 20 mg one every evening.  DISCHARGE INSTRUCTIONS: 1. Activity as tolerated.  May use walker or four-point cane if needed. 2. Low fat, low salt diet. 3. Follow-up with [name], M.D., in [place], [place],    September 11, 2001, at 11 a.m.  HISTORY OF PRESENT ILLNESS:  On August 18, 2001, Ms. Casey Vasquez called complaining of lightheadedness.  She initially felt very weak on the telephone, went to lie down and then her heart began beating rapidly.  A neighbor was called. Blood pressure was 160/110.  Heart rate was too rapid to count. She was nauseated.  No shortness of breath or diaphoresis or chest pain. She did have left arm weakness.  EMS was called.  Heart rate was SVT in the 160s.  She was given  adenosine 6 mg IV and converted to sinus rhythm.  She continued to have dizziness, lightheaded feeling in her head despite being in sinus rhythm. Left arm weakness had resolved.  She stated she had been significantly weak and dizzy for one week.  She says occasionally her heart rate goes up but does not happen often.  PAST MEDICAL HISTORY: 1. Cardiac:  She had a normal coronary artery catheterization in January 2001    with normal EF. 2. She has a history of dizziness. 3. History of hypertension. 4. Recent work-up at [facility], for feet numbness, pain    in the lower legs, most likely neuropathy. 5. History of left total knee. 6. History of syncope 15 years ago, told it was a seizure but she weakness    never given seizure medications.  ALLERGIES:  PENICILLIN causes rash.  CELEBREX she does not tolerate.  OUTPATIENT MEDICATIONS: 1. Provera on days one through 10. 2. Tenoretic 50/25 one daily. 3. K-Dur 20 mEq daily. 4. Lotensin 10 daily. 5. Librax 50 one daily and p.r.n. 6. Extra Strength Tylenol p.r.n. 7. Ortho-Est one daily 0.625. 8. Neurontin  300 mg two at bedtime.  For family history, social history, review of systems see H&P.  DISCHARGE PHYSICAL EXAMINATION:  GENERAL APPEARANCE:  An alert and oriented white female who complains of dizziness.  VITAL SIGNS:  Blood pressure 139/57, pulse 57, respiratory rate 20, temperature 98.8.  Room air oxygen saturation  98%.  NECK:  Supple, no carotid bruits.  LUNGS:  Clear without wheezing, rhonchi or rales.  CARDIOVASCULAR:  S1 and S2, regular rate and rhythm.  No murmurs noted.  ABDOMEN:  Soft and nontender, positive bowel sounds.  EXTREMITIES:  Lower extremities without edema.  NEUROLOGICAL:  Nonfocal per Dr. Elsie Lincoln.  LABORATORY DATA:  Initial labs revealed hemoglobin 13.5, hematocrit 39.6, wbc 7.9, platelets 298; neutrophils 66, lymphs 25, monos 7, eosinophils 3, baso 1, sed rate 11.  Protime 14.2, INR  1.1, PTT 33.  Sodium 144, potassium 3.8, chloride 108, CO2 30, glucose 112, BUN 19, creatinine 1, calcium 9.2, total protein 5.7, albumin 3.3.  Potassium did drop to a low of 3.3 but by the date of discharge she was 3.8.  ALT 13, AST 20, total bilirubin 0.7, magnesium 2.1. Cardiac enzymes were done x 3 and all negative.  No myocardial infarction. Lipid panel showed total cholesterol 196, triglycerides 147, HDL 45, and LDL 122.  TSH 2.448.  UA was clear.  RADIOLOGY:  Portable chest x-ray on admission showed heart size is prominent, no active disease.  Follow-up CT of the head was done for left arm weakness.  A small amount of air in each cavernous sinus, could be due to air introduced recent IV access. Questionable right and left shunt within the heart or lungs.  There was concern of base of skull fracture. The patient had no injury to her head and no complaints of head pain.  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  No intercranial hemorrhage or mass effect.  Small amount of fluid in the inferior aspect of the sphenoid sinus on the right.  Questionable acute sinusitis.  Sinus films were done which showed no sinusitis.  A neurologic consult was obtained secondary to dizziness.  Carotid Dopplers were done which revealed mild noncalcific plaque throughout bilaterally.  No ICA stenosis bilaterally.  Vertebral artery flow was antegrade bilaterally.  Dr. Sharene Skeans saw the patient at discharge and felt cane or walker may be needed for improvement of mobility and he suspected vertebral arterial insufficiency.  HOSPITAL COURSE:  [name] was admitted by Delrae Rend, M.D., on call for Dr. Elsie Lincoln, to telemetry unit.  Studies done as stated. She was already in a sinus rhythm. She did become bradycardic in the 40s frequently.  Her Tenoretic  was decreased by half. She continued with hypertension, uncontrolled.  Her Lotensin was gradually increased to 20 mg twice a day.   Unfortunately, Dr. Sharene Skeans, with her vertebral arterial flow of insufficiency, recommended goal blood pressure 130/80 and no less than that.  We have cut back a little at discharge on her Lotensin to give her a little bump but she will need to be monitored.  She may be a patient that would do well with an outpatient blood pressure monitor.  Additionally, the family is quite concerned about Mrs. Lichtenwalner taking care of her husband at home.  Wanted home health.  Case manager did discuss that with them and instructed them to have the husbands primary physician arrange.  By August 21, 2001, the patient was ready for discharge.  She will follow up with Dr. Elsie Lincoln. Dictated by:   Darcella Gasman Ingold, F.N.P.C. Attending Physician:  Ophelia Shoulder DD:  08/21/01 TD:  08/22/01 Job: [id number]

## 2011-04-04 ENCOUNTER — Encounter: Payer: Medicare Other | Admitting: Cardiology

## 2011-05-01 ENCOUNTER — Encounter: Payer: Self-pay | Admitting: Cardiology

## 2011-05-02 ENCOUNTER — Ambulatory Visit (INDEPENDENT_AMBULATORY_CARE_PROVIDER_SITE_OTHER): Payer: Medicare Other | Admitting: Cardiology

## 2011-05-02 ENCOUNTER — Encounter: Payer: Self-pay | Admitting: Cardiology

## 2011-05-02 DIAGNOSIS — I4949 Other premature depolarization: Secondary | ICD-10-CM

## 2011-05-02 DIAGNOSIS — R609 Edema, unspecified: Secondary | ICD-10-CM

## 2011-05-02 DIAGNOSIS — I493 Ventricular premature depolarization: Secondary | ICD-10-CM

## 2011-05-02 NOTE — Patient Instructions (Signed)
Please continue your current medications as listed. Follow up with Dr Antoine Poche in 2 months

## 2011-05-02 NOTE — Assessment & Plan Note (Signed)
I suspect this is related to venous insufficiency. She doesn't have evidence of heart failure otherwise. We discussed possibly studies to include venous Dopplers were goes. She would rather pursue conservative therapy. Toward that end I taught her how to keep her feet elevated. We discussed salt and fluid restriction. I will prescribe compression stockings. It may be that her nephew to help her put these on. If however this does not work we can consider further studies. At this point she does not want to take diuretics.

## 2011-05-02 NOTE — Assessment & Plan Note (Signed)
She's had no further symptoms. No change in therapy or further evaluation is indicated.

## 2011-05-02 NOTE — Progress Notes (Signed)
HPI The patient presents for evaluation of edema. I saw her last in the hospital in February when she had some weakness. No further cardiac workup was necessary at that time. She did wear an event monitor afterwards he demonstrated some PVCs but no sustained dysrhythmias. Since that time she's had no presyncope or syncope. She gets around her home with a walker. She denies any chest pressure, neck or arm discomfort. Her biggest complaint has been progressive lower extremity swelling. This goes away with her feet being elevated and is less in the morning and worse at night. She doesn't weigh herself daily. She doesn't restrict salt though she doesn't use excessive salt. She does not keep her feet elevated wear compression stockings.  Allergies  Allergen Reactions  . Penicillins     Current Outpatient Prescriptions  Medication Sig Dispense Refill  . atenolol (TENORMIN) 25 MG tablet Take 25 mg by mouth. 1/2 po daily       . Atenolol-Chlorthalidone (TENORETIC 50 PO) Take by mouth. 1/2 po daily       . benazepril (LOTENSIN) 20 MG tablet Take 20 mg by mouth daily.        . calcium citrate-vitamin D (CITRACAL+D) 315-200 MG-UNIT per tablet Take 1 tablet by mouth 2 (two) times daily.        . Cholecalciferol (VITAMIN D-3 PO) Take by mouth daily.        Marland Kitchen gabapentin (NEURONTIN) 300 MG capsule Take 300 mg by mouth 3 (three) times daily.        Marland Kitchen HYDROcodone-acetaminophen (NORCO) 5-325 MG per tablet Take 1 tablet by mouth every 6 (six) hours as needed.        Marland Kitchen levothyroxine (SYNTHROID, LEVOTHROID) 25 MCG tablet Take 25 mcg by mouth daily.        . meclizine (MEDI-MECLIZINE) 25 MG tablet Take 25 mg by mouth 3 (three) times daily as needed.        . potassium chloride SA (K-DUR,KLOR-CON) 20 MEQ tablet Take 20 mEq by mouth.        . zolpidem (AMBIEN) 5 MG tablet Take 5 mg by mouth at bedtime as needed.          Past Medical History  Diagnosis Date  . HTN (hypertension)   . Dementia   . Hypothyroid   .  Neuropathy   . UTI (urinary tract infection)     Past Surgical History  Procedure Date  . Appendectomy   . Knee surgery   . Total hip arthroplasty     ROS:  As stated in the HPI and negative for all other systems.  PHYSICAL EXAM BP 182/86  Pulse 55  Resp 16  Ht 5\' 4"  (1.626 m)  Wt 152 lb (68.947 kg)  BMI 26.09 kg/m2 GENERAL:  Well appearing HEENT:  Pupils equal round and reactive, fundi not visualized, oral mucosa unremarkable NECK:  No jugular venous distention, waveform within normal limits, carotid upstroke brisk and symmetric, no bruits, no thyromegaly LYMPHATICS:  No cervical, inguinal adenopathy LUNGS:  Clear to auscultation bilaterally BACK:  No CVA tenderness CHEST:  Unremarkable HEART:  PMI not displaced or sustained,S1 and S2 within normal limits, no S3, no S4, no clicks, no rubs, no murmurs ABD:  Flat, positive bowel sounds normal in frequency in pitch, no bruits, no rebound, no guarding, no midline pulsatile mass, no hepatomegaly, no splenomegaly EXT:  2 plus pulses upper, severe edema, no cyanosis no clubbing SKIN:  No rashes no nodules NEURO:  Cranial nerves  II through XII grossly intact, motor grossly intact throughout [facility]:  Cognitively intact, oriented to person place and time  EKG: Sinus rhythm, rate 55, axis within normal limits, intervals within normal limits, no acute ST-T wave changes.   ASSESSMENT AND PLAN

## 2011-07-11 ENCOUNTER — Ambulatory Visit (INDEPENDENT_AMBULATORY_CARE_PROVIDER_SITE_OTHER): Payer: Medicare Other | Admitting: Cardiology

## 2011-07-11 ENCOUNTER — Encounter: Payer: Self-pay | Admitting: Cardiology

## 2011-07-11 DIAGNOSIS — R609 Edema, unspecified: Secondary | ICD-10-CM

## 2011-07-11 DIAGNOSIS — I4949 Other premature depolarization: Secondary | ICD-10-CM

## 2011-07-11 DIAGNOSIS — I10 Essential (primary) hypertension: Secondary | ICD-10-CM | POA: Insufficient documentation

## 2011-07-11 DIAGNOSIS — I493 Ventricular premature depolarization: Secondary | ICD-10-CM

## 2011-07-11 MED ORDER — BENAZEPRIL HCL 40 MG PO TABS: 40.0000 mg | ORAL_TABLET | Freq: Every day | ORAL | Status: DC

## 2011-07-11 NOTE — Progress Notes (Signed)
HPI The patient presents for evaluation of edema.  When I last saw her she was having increased edema.  I prescribed compression stockings which she is now wearing. She has helped him a lot and she says they have reduce her edema. She feels better with this. She is not having any shortness of breath, PND or orthopnea. She has not been feeling any palpitations, presyncope or syncope. She has no chest pressure, neck or arm discomfort. She gets around her house with her walker.  Allergies  Allergen Reactions  . Penicillins     Current Outpatient Prescriptions  Medication Sig Dispense Refill  . atenolol (TENORMIN) 25 MG tablet Take 25 mg by mouth. 1/2 po daily       . Atenolol-Chlorthalidone (TENORETIC 50 PO) Take by mouth. 1/2 po daily       . benazepril (LOTENSIN) 20 MG tablet Take 20 mg by mouth daily.        . calcium citrate-vitamin D (CITRACAL+D) 315-200 MG-UNIT per tablet Take 1 tablet by mouth 2 (two) times daily.        . Cholecalciferol (VITAMIN D-3 PO) Take by mouth daily.        Marland Kitchen gabapentin (NEURONTIN) 300 MG capsule Take 300 mg by mouth 3 (three) times daily.        Marland Kitchen HYDROcodone-acetaminophen (NORCO) 5-325 MG per tablet Take 1 tablet by mouth every 6 (six) hours as needed.        Marland Kitchen levothyroxine (SYNTHROID, LEVOTHROID) 25 MCG tablet Take 25 mcg by mouth daily.        . meclizine (MEDI-MECLIZINE) 25 MG tablet Take 25 mg by mouth 3 (three) times daily as needed.        . potassium chloride SA (K-DUR,KLOR-CON) 20 MEQ tablet Take 20 mEq by mouth.        . zolpidem (AMBIEN) 5 MG tablet Take 5 mg by mouth at bedtime as needed.          Past Medical History  Diagnosis Date  . HTN (hypertension)   . Dementia   . Hypothyroid   . Neuropathy   . UTI (urinary tract infection)     Past Surgical History  Procedure Date  . Appendectomy   . Knee surgery   . Total hip arthroplasty     ROS:  As stated in the HPI and negative for all other systems.  PHYSICAL EXAM There were no  vitals taken for this visit. GENERAL:  Well appearing HEENT:  Pupils equal round and reactive, fundi not visualized, oral mucosa unremarkable NECK:  No jugular venous distention, waveform within normal limits, carotid upstroke brisk and symmetric, no bruits, no thyromegaly LYMPHATICS:  No cervical, inguinal adenopathy LUNGS:  Clear to auscultation bilaterally BACK:  No CVA tenderness CHEST:  Unremarkable HEART:  PMI not displaced or sustained,S1 and S2 within normal limits, no S3, no S4, no clicks, no rubs, no murmurs ABD:  Flat, positive bowel sounds normal in frequency in pitch, no bruits, no rebound, no guarding, no midline pulsatile mass, no hepatomegaly, no splenomegaly EXT:  2 plus pulses upper, moderate edema (compression stockings) no cyanosis no clubbing SKIN:  No rashes no nodules NEURO:  Cranial nerves II through XII grossly intact, motor grossly intact throughout PSYCH:  Cognitively intact, oriented to person place and time  ASSESSMENT AND PLAN

## 2011-07-11 NOTE — Assessment & Plan Note (Signed)
Her edema is much improved with compression stockings and conservative therapy. She will continue with this.

## 2011-07-11 NOTE — Assessment & Plan Note (Signed)
These are not symptomatic.  No change in therapy is indicated.

## 2011-07-11 NOTE — Patient Instructions (Addendum)
Please increase Benazepril to 40 mg a day  Please have blood work drawn in 2 weeks  Please follow up with Dr Antoine Poche in 2 months

## 2011-07-11 NOTE — Assessment & Plan Note (Signed)
Her blood pressure has been very elevated on consecutive readings. I will increase her benazepril to 40 mg daily and check a basic metabolic profile in 2 weeks.

## 2011-07-30 ENCOUNTER — Encounter: Payer: Self-pay | Admitting: Cardiology

## 2011-09-19 ENCOUNTER — Encounter: Payer: Self-pay | Admitting: Cardiology

## 2011-09-19 ENCOUNTER — Ambulatory Visit (INDEPENDENT_AMBULATORY_CARE_PROVIDER_SITE_OTHER): Payer: Medicare Other | Admitting: Cardiology

## 2011-09-19 DIAGNOSIS — I10 Essential (primary) hypertension: Secondary | ICD-10-CM

## 2011-09-19 DIAGNOSIS — R609 Edema, unspecified: Secondary | ICD-10-CM

## 2011-09-19 DIAGNOSIS — I493 Ventricular premature depolarization: Secondary | ICD-10-CM

## 2011-09-19 DIAGNOSIS — I4949 Other premature depolarization: Secondary | ICD-10-CM

## 2011-09-19 MED ORDER — AMLODIPINE BESYLATE 2.5 MG PO TABS: 2.5000 mg | ORAL_TABLET | Freq: Every day | ORAL | Status: DC

## 2011-09-19 NOTE — Progress Notes (Signed)
HPI The patient presents for evaluation of edema.  She is not having any shortness of breath, PND or orthopnea. She has not been feeling any palpitations, presyncope or syncope. She has no chest pressure, neck or arm discomfort. She gets around her house with her walker.  At the last visit I increased her ACE inhibitor. However today her blood pressure is elevated.  She had no problems taking this. She's had no new cough. She continues to get some lower extremity swelling. However, she is more bothered by swelling in her "knees" than in her ankles or lower legs. She's not describing new shortness of breath, PND or orthopnea.  Allergies  Allergen Reactions  . Penicillins     Current Outpatient Prescriptions  Medication Sig Dispense Refill  . atenolol (TENORMIN) 25 MG tablet Take 25 mg by mouth daily.        Marland Kitchen atenolol-chlorthalidone (TENORETIC) 50-25 MG per tablet 1 tablet daily.       Marland Kitchen atorvastatin (LIPITOR) 20 MG tablet       . benazepril (LOTENSIN) 40 MG tablet Take 1 tablet (40 mg total) by mouth daily.  30 tablet  11  . calcium citrate-vitamin D (CITRACAL+D) 315-200 MG-UNIT per tablet Take 1 tablet by mouth 2 (two) times daily.        . Cholecalciferol (VITAMIN D-3 PO) Take by mouth daily.        . ciprofloxacin (CIPRO) 500 MG tablet       . gabapentin (NEURONTIN) 300 MG capsule Take 300 mg by mouth 3 (three) times daily.        Marland Kitchen HYDROcodone-acetaminophen (NORCO) 5-325 MG per tablet Take 1 tablet by mouth every 6 (six) hours as needed.        Marland Kitchen levothyroxine (SYNTHROID, LEVOTHROID) 25 MCG tablet Take 25 mcg by mouth daily.        . meclizine (MEDI-MECLIZINE) 25 MG tablet Take 25 mg by mouth 3 (three) times daily as needed.        . potassium chloride SA (K-DUR,KLOR-CON) 20 MEQ tablet Take 20 mEq by mouth.        . zolpidem (AMBIEN) 5 MG tablet Take 5 mg by mouth at bedtime as needed.          Past Medical History  Diagnosis Date  . HTN (hypertension)   . Dementia   .  Hypothyroid   . Neuropathy   . UTI (urinary tract infection)     Past Surgical History  Procedure Date  . Appendectomy   . Knee surgery   . Total hip arthroplasty     ROS:  As stated in the HPI and negative for all other systems.  PHYSICAL EXAM BP 179/81  Pulse 66  Ht 5\' 2"  (1.575 m)  Wt 150 lb (68.04 kg)  BMI 27.44 kg/m2 PHYSICAL EXAM GEN:  No distress NECK:  No jugular venous distention at 90 degrees, waveform within normal limits, carotid upstroke brisk and symmetric, no bruits, no thyromegaly LYMPHATICS:  No cervical adenopathy LUNGS:  Clear to auscultation bilaterally BACK:  No CVA tenderness CHEST:  Unremarkable HEART:  S1 and S2 within normal limits, no S3, no S4, no clicks, no rubs, no murmurs ABD:  Positive bowel sounds normal in frequency in pitch, no bruits, no rebound, no guarding, unable to assess midline mass or bruit with the patient seated. EXT:  2 plus pulses throughout, mild edema, no cyanosis no clubbing SKIN:  No rashes no nodules NEURO:  Cranial nerves II through  XII grossly intact, motor grossly intact throughout PSYCH:  Cognitively intact, oriented to person place and time   ASSESSMENT AND PLAN

## 2011-09-19 NOTE — Assessment & Plan Note (Signed)
Her blood pressure is not well controlled.  I will add Norvasc 2.5 mg.  I do not think that this will contribute to edema.

## 2011-09-19 NOTE — Assessment & Plan Note (Signed)
We are managing this conservatively. No change in therapy is indicated. She's wearing stockings that are not quite compression stockings.

## 2011-09-19 NOTE — Patient Instructions (Addendum)
Please start Amlodipine 2.5 mg a day Continue all other medications as listed  Follow up in 6 months with Dr Antoine Poche.  You will receive a letter in the mail 2 months before you are due.  Please call us when you receive this letter to schedule your follow up appointment.

## 2011-09-19 NOTE — Assessment & Plan Note (Signed)
She notices these rarely. I will make no change to her regimen.

## 2011-12-21 ENCOUNTER — Emergency Department (HOSPITAL_COMMUNITY): Payer: Medicare Other

## 2011-12-21 ENCOUNTER — Inpatient Hospital Stay (HOSPITAL_COMMUNITY)
Admission: EM | Admit: 2011-12-21 | Discharge: 2011-12-24 | DRG: 554 | Disposition: A | Discharge status: Home / Self Care | Payer: Medicare Other | Attending: Internal Medicine | Admitting: Internal Medicine

## 2011-12-21 ENCOUNTER — Encounter (HOSPITAL_COMMUNITY): Payer: Self-pay | Admitting: Emergency Medicine

## 2011-12-21 DIAGNOSIS — E039 Hypothyroidism, unspecified: Secondary | ICD-10-CM | POA: Diagnosis present

## 2011-12-21 DIAGNOSIS — N390 Urinary tract infection, site not specified: Secondary | ICD-10-CM | POA: Diagnosis present

## 2011-12-21 DIAGNOSIS — M1710 Unilateral primary osteoarthritis, unspecified knee: Principal | ICD-10-CM | POA: Diagnosis present

## 2011-12-21 DIAGNOSIS — M25569 Pain in unspecified knee: Secondary | ICD-10-CM | POA: Diagnosis present

## 2011-12-21 DIAGNOSIS — G589 Mononeuropathy, unspecified: Secondary | ICD-10-CM | POA: Diagnosis present

## 2011-12-21 DIAGNOSIS — M1711 Unilateral primary osteoarthritis, right knee: Secondary | ICD-10-CM | POA: Diagnosis present

## 2011-12-21 DIAGNOSIS — I10 Essential (primary) hypertension: Secondary | ICD-10-CM | POA: Diagnosis present

## 2011-12-21 DIAGNOSIS — G629 Polyneuropathy, unspecified: Secondary | ICD-10-CM | POA: Diagnosis present

## 2011-12-21 DIAGNOSIS — R7989 Other specified abnormal findings of blood chemistry: Secondary | ICD-10-CM | POA: Diagnosis present

## 2011-12-21 DIAGNOSIS — I498 Other specified cardiac arrhythmias: Secondary | ICD-10-CM | POA: Diagnosis present

## 2011-12-21 DIAGNOSIS — I4949 Other premature depolarization: Secondary | ICD-10-CM | POA: Diagnosis present

## 2011-12-21 DIAGNOSIS — R001 Bradycardia, unspecified: Secondary | ICD-10-CM

## 2011-12-21 DIAGNOSIS — I493 Ventricular premature depolarization: Secondary | ICD-10-CM | POA: Diagnosis present

## 2011-12-21 DIAGNOSIS — F068 Other specified mental disorders due to known physiological condition: Secondary | ICD-10-CM | POA: Diagnosis present

## 2011-12-21 DIAGNOSIS — Z96649 Presence of unspecified artificial hip joint: Secondary | ICD-10-CM

## 2011-12-21 DIAGNOSIS — Y92009 Unspecified place in unspecified non-institutional (private) residence as the place of occurrence of the external cause: Secondary | ICD-10-CM

## 2011-12-21 DIAGNOSIS — E785 Hyperlipidemia, unspecified: Secondary | ICD-10-CM | POA: Diagnosis present

## 2011-12-21 DIAGNOSIS — R296 Repeated falls: Secondary | ICD-10-CM | POA: Diagnosis present

## 2011-12-21 HISTORY — DX: Other chronic pain: G89.29

## 2011-12-21 LAB — CBC
HCT: 39.3 % (ref 36.0–46.0)
Hemoglobin: 13.4 g/dL (ref 12.0–15.0)
MCH: 30.1 pg (ref 26.0–34.0)
MCHC: 34.1 g/dL (ref 30.0–36.0)
RBC: 4.45 MIL/uL (ref 3.87–5.11)

## 2011-12-21 LAB — URINALYSIS, ROUTINE W REFLEX MICROSCOPIC
Bilirubin Urine: NEGATIVE
Ketones, ur: NEGATIVE mg/dL
Nitrite: NEGATIVE
Urobilinogen, UA: 0.2 mg/dL (ref 0.0–1.0)

## 2011-12-21 LAB — BASIC METABOLIC PANEL
BUN: 25 mg/dL — ABNORMAL HIGH (ref 6–23)
Chloride: 101 mEq/L (ref 96–112)
Creatinine, Ser: 0.87 mg/dL (ref 0.50–1.10)
Glucose, Bld: 104 mg/dL — ABNORMAL HIGH (ref 70–99)
Potassium: 3.5 mEq/L (ref 3.5–5.1)

## 2011-12-21 LAB — DIFFERENTIAL
Basophils Relative: 0 % (ref 0–1)
Lymphs Abs: 2.1 10*3/uL (ref 0.7–4.0)
Monocytes Absolute: 1.2 10*3/uL — ABNORMAL HIGH (ref 0.1–1.0)
Monocytes Relative: 7 % (ref 3–12)
Neutro Abs: 12.8 10*3/uL — ABNORMAL HIGH (ref 1.7–7.7)
Neutrophils Relative %: 79 % — ABNORMAL HIGH (ref 43–77)

## 2011-12-21 MED ORDER — CALCIUM CITRATE-VITAMIN D 315-200 MG-UNIT PO TABS: 1.0000 | ORAL_TABLET | Freq: Two times a day (BID) | ORAL | Status: DC

## 2011-12-21 MED ORDER — CALCIUM CARBONATE-VITAMIN D 500-200 MG-UNIT PO TABS
1.0000 | ORAL_TABLET | Freq: Two times a day (BID) | ORAL | Status: DC
  Administered 2011-12-21 – 2011-12-24 (×6): 1 via ORAL
  Filled 2011-12-21 (×7): qty 1

## 2011-12-21 MED ORDER — ONDANSETRON HCL 4 MG/2ML IJ SOLN: 4.0000 mg | Freq: Three times a day (TID) | INTRAMUSCULAR | Status: DC | PRN

## 2011-12-21 MED ORDER — ONDANSETRON HCL 4 MG/2ML IJ SOLN: 4.0000 mg | Freq: Four times a day (QID) | INTRAMUSCULAR | Status: DC | PRN

## 2011-12-21 MED ORDER — DOCUSATE SODIUM 100 MG PO CAPS
100.0000 mg | ORAL_CAPSULE | Freq: Two times a day (BID) | ORAL | Status: DC
  Administered 2011-12-21 – 2011-12-24 (×6): 100 mg via ORAL
  Filled 2011-12-21 (×7): qty 1

## 2011-12-21 MED ORDER — ATENOLOL-CHLORTHALIDONE 50-25 MG PO TABS: 0.5000 | ORAL_TABLET | Freq: Every day | ORAL | Status: DC

## 2011-12-21 MED ORDER — LEVOTHYROXINE SODIUM 25 MCG PO TABS
25.0000 ug | ORAL_TABLET | Freq: Every day | ORAL | Status: DC
  Administered 2011-12-21 – 2011-12-24 (×4): 25 ug via ORAL
  Filled 2011-12-21 (×4): qty 1

## 2011-12-21 MED ORDER — SODIUM CHLORIDE 0.9 % IJ SOLN
3.0000 mL | Freq: Two times a day (BID) | INTRAMUSCULAR | Status: DC
  Administered 2011-12-21 – 2011-12-24 (×5): 3 mL via INTRAVENOUS

## 2011-12-21 MED ORDER — ACETAMINOPHEN 650 MG RE SUPP: 650.0000 mg | Freq: Four times a day (QID) | RECTAL | Status: DC | PRN

## 2011-12-21 MED ORDER — ACETAMINOPHEN 325 MG PO TABS: 650.0000 mg | ORAL_TABLET | Freq: Four times a day (QID) | ORAL | Status: DC | PRN

## 2011-12-21 MED ORDER — SODIUM CHLORIDE 0.45 % IV SOLN
INTRAVENOUS | Status: AC
  Administered 2011-12-21: 19:00:00 via INTRAVENOUS

## 2011-12-21 MED ORDER — ENOXAPARIN SODIUM 40 MG/0.4ML ~~LOC~~ SOLN
40.0000 mg | SUBCUTANEOUS | Status: DC
  Administered 2011-12-21 – 2011-12-23 (×3): 40 mg via SUBCUTANEOUS
  Filled 2011-12-21 (×4): qty 0.4

## 2011-12-21 MED ORDER — AMLODIPINE BESYLATE 2.5 MG PO TABS
2.5000 mg | ORAL_TABLET | Freq: Every day | ORAL | Status: DC
  Administered 2011-12-21 – 2011-12-24 (×3): 2.5 mg via ORAL
  Filled 2011-12-21 (×4): qty 1

## 2011-12-21 MED ORDER — ALBUTEROL SULFATE (5 MG/ML) 0.5% IN NEBU: 2.5000 mg | INHALATION_SOLUTION | RESPIRATORY_TRACT | Status: DC | PRN

## 2011-12-21 MED ORDER — OXYCODONE HCL 5 MG PO TABS
5.0000 mg | ORAL_TABLET | ORAL | Status: DC | PRN
  Administered 2011-12-21 – 2011-12-22 (×2): 5 mg via ORAL
  Filled 2011-12-21 (×2): qty 1

## 2011-12-21 MED ORDER — ZOLPIDEM TARTRATE 5 MG PO TABS
5.0000 mg | ORAL_TABLET | Freq: Every evening | ORAL | Status: DC | PRN
  Administered 2011-12-21: 5 mg via ORAL
  Filled 2011-12-21: qty 1

## 2011-12-21 MED ORDER — BENAZEPRIL HCL 40 MG PO TABS
40.0000 mg | ORAL_TABLET | Freq: Every day | ORAL | Status: DC
  Administered 2011-12-21 – 2011-12-24 (×3): 40 mg via ORAL
  Filled 2011-12-21 (×4): qty 1

## 2011-12-21 MED ORDER — GABAPENTIN 300 MG PO CAPS
900.0000 mg | ORAL_CAPSULE | Freq: Every day | ORAL | Status: DC
  Administered 2011-12-21 – 2011-12-23 (×3): 900 mg via ORAL
  Filled 2011-12-21 (×4): qty 3

## 2011-12-21 MED ORDER — ATENOLOL 25 MG PO TABS
25.0000 mg | ORAL_TABLET | Freq: Every day | ORAL | Status: DC
  Administered 2011-12-21: 25 mg via ORAL
  Filled 2011-12-21 (×2): qty 1

## 2011-12-21 MED ORDER — CHLORTHALIDONE 25 MG PO TABS
12.5000 mg | ORAL_TABLET | Freq: Every day | ORAL | Status: DC
  Administered 2011-12-21: 25 mg via ORAL
  Filled 2011-12-21 (×2): qty 0.5

## 2011-12-21 MED ORDER — POTASSIUM CHLORIDE CRYS ER 20 MEQ PO TBCR
20.0000 meq | EXTENDED_RELEASE_TABLET | Freq: Every day | ORAL | Status: DC
  Administered 2011-12-21 – 2011-12-24 (×4): 20 meq via ORAL
  Filled 2011-12-21 (×4): qty 1

## 2011-12-21 MED ORDER — CIPROFLOXACIN IN D5W 400 MG/200ML IV SOLN
400.0000 mg | Freq: Two times a day (BID) | INTRAVENOUS | Status: DC
  Administered 2011-12-21 – 2011-12-23 (×4): 400 mg via INTRAVENOUS
  Filled 2011-12-21 (×6): qty 200

## 2011-12-21 MED ORDER — ACETAMINOPHEN 325 MG PO TABS: 650.0000 mg | ORAL_TABLET | ORAL | Status: DC | PRN

## 2011-12-21 MED ORDER — ONDANSETRON HCL 4 MG PO TABS: 4.0000 mg | ORAL_TABLET | Freq: Four times a day (QID) | ORAL | Status: DC | PRN

## 2011-12-21 MED ORDER — SIMVASTATIN 40 MG PO TABS
40.0000 mg | ORAL_TABLET | Freq: Every day | ORAL | Status: DC
  Administered 2011-12-21: 40 mg via ORAL
  Filled 2011-12-21 (×2): qty 1

## 2011-12-21 NOTE — H&P (Signed)
Casey Vasquez is an 78 y.o. female.    PCP: Rudi Heap, MD, MD   Chief Complaint: Fall  HPI: This is 78 year old, Caucasian female, with a past medical history of dementia, PVCs, hypertension, hyperlipidemia, and neuropathy, who lives at home by herself, but her nephew lives close by. She was found this morning by her nephew in the bathroom. She was on the floor of the bathroom. She told him that she had slid down to the floor. There was no mention of any head injury or loss of consciousness. Patient is complaining of pain in the right knee area, which has been ongoing for a long time. Patient has dementia and has short-term memory loss and is unable to provide much history. She denies any chest pain or shortness of breath. Denies any nausea, vomiting, or abdominal pain. She has been seen by orthopedic surgeon, Dr. Dion Saucier in the past for the knee pain.   Home Medications: Prior to Admission medications   Medication Sig Start Date End Date Taking? Authorizing Provider  amLODipine (NORVASC) 2.5 MG tablet Take 1 tablet (2.5 mg total) by mouth daily. 09/19/11 09/18/12 Yes Rollene Rotunda, MD  atenolol-chlorthalidone (TENORETIC) 50-25 MG per tablet Take 0.5 tablets by mouth daily.  09/12/11  Yes Historical Provider, MD  atorvastatin (LIPITOR) 20 MG tablet Take 20 mg by mouth daily.  08/29/11  Yes Historical Provider, MD  benazepril (LOTENSIN) 40 MG tablet Take 1 tablet (40 mg total) by mouth daily. 07/11/11  Yes Rollene Rotunda, MD  calcium citrate-vitamin D (CITRACAL+D) 315-200 MG-UNIT per tablet Take 1 tablet by mouth 2 (two) times daily.     Yes Historical Provider, MD  cholecalciferol (VITAMIN D) 1000 UNITS tablet Take 1,000 Units by mouth daily.   Yes Historical Provider, MD  gabapentin (NEURONTIN) 300 MG capsule Take 900 mg by mouth at bedtime.    Yes Historical Provider, MD  levothyroxine (SYNTHROID, LEVOTHROID) 25 MCG tablet Take 25 mcg by mouth daily.     Yes Historical Provider, MD    potassium chloride SA (K-DUR,KLOR-CON) 20 MEQ tablet Take 20 mEq by mouth daily.    Yes Historical Provider, MD  zolpidem (AMBIEN) 5 MG tablet Take 5 mg by mouth at bedtime.    Yes Historical Provider, MD    Allergies:  Allergies  Allergen Reactions  . Penicillins     Past Medical History: Past Medical History  Diagnosis Date  . HTN (hypertension)   . Dementia   . Hypothyroid   . Neuropathy   . UTI (urinary tract infection)   . Chronic pain     Past Surgical History  Procedure Date  . Appendectomy   . Knee surgery   . Total hip arthroplasty     Social History:  reports that she has never smoked. She has never used smokeless tobacco. She reports that she does not drink alcohol. Her drug history not on file.  Family History:  Family History  Problem Relation Age of Onset  . Hypertension      Review of Systems - unobtainable from patient due to dementia  Physical Examination Blood pressure 153/73, pulse 69, temperature 98.1 F (36.7 C), temperature source Oral, resp. rate 16, SpO2 100.00%.  General appearance: alert, cooperative and no distress Head: Normocephalic, without obvious abnormality, atraumatic Eyes: conjunctivae/corneas clear. PERRL, EOM's intact.  Throat: lips, mucosa, and tongue normal; teeth and gums normal Neck: no adenopathy, no carotid bruit, no JVD, supple, symmetrical, trachea midline and thyroid not enlarged, symmetric, no tenderness/mass/nodules  Back: symmetric, no curvature. ROM normal. No CVA tenderness. Resp: clear to auscultation bilaterally Cardio: regular rate and rhythm, S1, S2 normal, no murmur, click, rub or gallop GI: soft, non-tender; bowel sounds normal; no masses,  no organomegaly Extremities: Right knee is swollen as is the left knee. But right knee has limited ROM. No abnormality of hip. Pulses: 2+ and symmetric Skin: Skin color, texture, turgor normal. No rashes or lesions Lymph nodes: Cervical, supraclavicular, and axillary  nodes normal. Neurologic: Grossly normal. Confused. No focal deficits.  Laboratory Data: Results for orders placed during the hospital encounter of 12/21/11 (from the past 48 hour(s))  CBC     Status: Abnormal   Collection Time   12/21/11 10:51 AM      Component Value Range Comment   WBC 16.2 (*) 4.0 - 10.5 (K/uL)    RBC 4.45  3.87 - 5.11 (MIL/uL)    Hemoglobin 13.4  12.0 - 15.0 (g/dL)    HCT 96.0  45.4 - 09.8 (%)    MCV 88.3  78.0 - 100.0 (fL)    MCH 30.1  26.0 - 34.0 (pg)    MCHC 34.1  30.0 - 36.0 (g/dL)    RDW 11.9  14.7 - 82.9 (%)    Platelets 283  150 - 400 (K/uL)   DIFFERENTIAL     Status: Abnormal   Collection Time   12/21/11 10:51 AM      Component Value Range Comment   Neutrophils Relative 79 (*) 43 - 77 (%)    Neutro Abs 12.8 (*) 1.7 - 7.7 (K/uL)    Lymphocytes Relative 13  12 - 46 (%)    Lymphs Abs 2.1  0.7 - 4.0 (K/uL)    Monocytes Relative 7  3 - 12 (%)    Monocytes Absolute 1.2 (*) 0.1 - 1.0 (K/uL)    Eosinophils Relative 0  0 - 5 (%)    Eosinophils Absolute 0.1  0.0 - 0.7 (K/uL)    Basophils Relative 0  0 - 1 (%)    Basophils Absolute 0.0  0.0 - 0.1 (K/uL)   BASIC METABOLIC PANEL     Status: Abnormal   Collection Time   12/21/11 10:51 AM      Component Value Range Comment   Sodium 141  135 - 145 (mEq/L)    Potassium 3.5  3.5 - 5.1 (mEq/L)    Chloride 101  96 - 112 (mEq/L)    CO2 31  19 - 32 (mEq/L)    Glucose, Bld 104 (*) 70 - 99 (mg/dL)    BUN 25 (*) 6 - 23 (mg/dL)    Creatinine, Ser 5.62  0.50 - 1.10 (mg/dL)    Calcium 9.2  8.4 - 10.5 (mg/dL)    GFR calc non Af Amer 59 (*) >90 (mL/min)    GFR calc Af Amer 68 (*) >90 (mL/min)   URINALYSIS, ROUTINE W REFLEX MICROSCOPIC     Status: Abnormal   Collection Time   12/21/11 11:09 AM      Component Value Range Comment   Color, Urine YELLOW  YELLOW     APPearance CLOUDY (*) CLEAR     Specific Gravity, Urine 1.017  1.005 - 1.030     pH 7.5  5.0 - 8.0     Glucose, UA NEGATIVE  NEGATIVE (mg/dL)    Hgb urine dipstick  NEGATIVE  NEGATIVE     Bilirubin Urine NEGATIVE  NEGATIVE     Ketones, ur NEGATIVE  NEGATIVE (mg/dL)  Protein, ur NEGATIVE  NEGATIVE (mg/dL)    Urobilinogen, UA 0.2  0.0 - 1.0 (mg/dL)    Nitrite NEGATIVE  NEGATIVE     Leukocytes, UA MODERATE (*) NEGATIVE    URINE MICROSCOPIC-ADD ON     Status: Abnormal   Collection Time   12/21/11 11:09 AM      Component Value Range Comment   Squamous Epithelial / LPF RARE  RARE     WBC, UA 11-20  <3 (WBC/hpf)    Bacteria, UA MANY (*) RARE     Urine-Other AMORPHOUS URATES/PHOSPHATES       Radiology Reports: Dg Chest 2 View  12/21/2011  *RADIOLOGY REPORT*  Clinical Data: Cough.  CHEST - 2 VIEW  Comparison: 11/20/2010  Findings: Calcified granuloma the right lung base.  Heart is borderline in size.  No acute opacities or effusions.  No acute bony abnormality.  IMPRESSION: Borderline heart size.  Old granulomatous disease.  No active disease.  Original Report Authenticated By: Cyndie Chime, M.D.   Dg Knee Complete 4 Views Right  12/21/2011  *RADIOLOGY REPORT*  Clinical Data: Knee pain.  No known injury.  RIGHT KNEE - COMPLETE 4+ VIEW  Comparison: None  Findings: Moderate joint effusion.  Advanced degenerative joint disease changes.  Near complete joint space loss in the medial compartment. No acute bony abnormality.  Specifically, no fracture, subluxation, or dislocation.  Soft tissues are intact.  IMPRESSION: Advanced degenerative changes.  Moderate joint effusion.  No acute findings.  Original Report Authenticated By: Cyndie Chime, M.D.    Assessment/Plan  Active Problems:  PVC's (premature ventricular contractions)  HTN (hypertension)  Urinary tract infection  Hyperlipidemia  Hypothyroidism  Neuropathy   #1 fall with right knee pain: X-rays have not shown any acute abnormalities. Family is requesting that Dr. Dion Saucier come and evaluate her. I have called him and am waiting for a call back. In the, meantime, we'll go ahead and get physical therapy  and occupational therapy evaluation. Pain control will be provided.  #2 urinary tract infection. Urine cultures will be followed up on. Patient will be put on ciprofloxacin.  #3 history of hypertension: Continue with current antihypertensive agents.  #4 history of hypothyroidism. Continue with Synthroid.  #5 history of neuropathy: Continue with Neurontin.  #6 dementia: This is she appears to be stable. There may be a little bit more confusion than baseline. Due to the infection.  Safety at home has been discussed with the family. Nephew tells me that he may be moving in with her within the next 2 weeks.  CODE STATUS was discussed with the one of her power of attorney's, who is the daughter, Lupita Leash phone number is [phone] and she tells me that she will get back to me after talking to the other power of attorney, which is the other daughter. That daughter's name is Olegario Messier, who is at [phone]. At this time patient will be full code unless we hear otherwise.  [facility]  [facility] Pager [phone]  12/21/2011, 4:14 PM

## 2011-12-21 NOTE — ED Notes (Signed)
Attempted to call pt's house to get in contact with nephew, no answer or voicemail. Will continue to try to get in contact with pt's family.

## 2011-12-21 NOTE — ED Provider Notes (Signed)
History     CSN: [id number]  Arrival date & time 12/21/11  9147   First MD Initiated Contact with Patient 12/21/11 1007      Chief Complaint  Patient presents with  . Knee Pain    had surgery to rt knee fell found in the bathroom but pt denies any fall states that she just slid down, hx dementia     (Consider location/radiation/quality/duration/timing/severity/associated sxs/prior treatment) The history is provided by the patient and the EMS personnel. The history is limited by the condition of the patient.  78 year old female with past medical history of dementia, hypertension presents with knee pain. According to EMS, the nephew found her in the bathroom this morning, and she told him that she had slid down to the floor. He does not believe that she hit her head or lost consciousness. She was complaining of pain to her right knee, and has had problems with this in the past. Patient is a poor historian and tells me at first that she was "helping the children this morning, and when I got home, the knee was hurting." She then tells me "I was helping the shut in ladies this morning when I was up on my feet too much and it began to hurt." She does not have a history of surgery to the knee.  She currently lives at home by herself; the nephew lives close by.  Level V caveat due to dementia.  Past Medical History  Diagnosis Date  . HTN (hypertension)   . Dementia   . Hypothyroid   . Neuropathy   . UTI (urinary tract infection)   . Chronic pain     Past Surgical History  Procedure Date  . Appendectomy   . Knee surgery   . Total hip arthroplasty     Family History  Problem Relation Age of Onset  . Hypertension      History  Substance Use Topics  . Smoking status: Never Smoker   . Smokeless tobacco: Not on file  . Alcohol Use: Not on file    OB History    Grav Para Term Preterm Abortions TAB SAB Ect Mult Living                  Review of Systems  Unable to perform  ROS: Dementia    Allergies  Penicillins  Home Medications   Current Outpatient Rx  Name Route Sig Dispense Refill  . AMLODIPINE BESYLATE 2.5 MG PO TABS Oral Take 1 tablet (2.5 mg total) by mouth daily. 30 tablet 11  . ATENOLOL 25 MG PO TABS Oral Take 25 mg by mouth daily.      . ATENOLOL-CHLORTHALIDONE 50-25 MG PO TABS  1 tablet daily.     . ATORVASTATIN CALCIUM 20 MG PO TABS      . BENAZEPRIL HCL 40 MG PO TABS Oral Take 1 tablet (40 mg total) by mouth daily. 30 tablet 11  . CALCIUM CITRATE-VITAMIN D 315-200 MG-UNIT PO TABS Oral Take 1 tablet by mouth 2 (two) times daily.      Marland Kitchen VITAMIN D-3 PO Oral Take by mouth daily.      Marland Kitchen CIPROFLOXACIN HCL 500 MG PO TABS      . GABAPENTIN 300 MG PO CAPS Oral Take 300 mg by mouth 3 (three) times daily.      Marland Kitchen HYDROCODONE-ACETAMINOPHEN 5-325 MG PO TABS Oral Take 1 tablet by mouth every 6 (six) hours as needed.      Marland Kitchen LEVOTHYROXINE  SODIUM 25 MCG PO TABS Oral Take 25 mcg by mouth daily.      Marland Kitchen MECLIZINE HCL 25 MG PO TABS Oral Take 25 mg by mouth 3 (three) times daily as needed.      Marland Kitchen POTASSIUM CHLORIDE CRYS ER 20 MEQ PO TBCR Oral Take 20 mEq by mouth.      . ZOLPIDEM TARTRATE 5 MG PO TABS Oral Take 5 mg by mouth at bedtime as needed.        BP 160/54  Pulse 50  Temp 97.9 F (36.6 C)  Resp 15  SpO2 100% Pt currently taking atenolol which likely accounts for bradycardia  Physical Exam  Nursing note and vitals reviewed. Constitutional: She appears well-developed and well-nourished. No distress.  HENT:  Head: Normocephalic and atraumatic.  Right Ear: External ear normal.  Left Ear: External ear normal.  Eyes: EOM are normal. Pupils are equal, round, and reactive to light.  Neck: Normal range of motion.  Cardiovascular: Normal rate, regular rhythm and normal heart sounds.   Pulmonary/Chest: Effort normal and breath sounds normal. She exhibits no tenderness.  Abdominal: Soft. There is no tenderness.  Musculoskeletal:       Well healed  surgical scar c/w TKR to L knee.  R knee without swelling, tenderness to palpation. No appreciable effusion or Baker cyst. FROM with drawer/stress/McMurrays unremarkable. Tenderness near pes anserine at proximal medial tibia. Neurovascularly intact distally with sensory intact to lt touch. DP/PT pulses intact.  Neurological: She is alert.  Skin: Skin is warm and dry. No rash noted. She is not diaphoretic.  Psychiatric: She has a normal mood and affect.    ED Course  Procedures (including critical care time)  Labs Reviewed  CBC - Abnormal; Notable for the following:    WBC 16.2 (*)    All other components within normal limits  DIFFERENTIAL - Abnormal; Notable for the following:    Neutrophils Relative 79 (*)    Neutro Abs 12.8 (*)    Monocytes Absolute 1.2 (*)    All other components within normal limits  BASIC METABOLIC PANEL - Abnormal; Notable for the following:    Glucose, Bld 104 (*)    BUN 25 (*)    GFR calc non Af Amer 59 (*)    GFR calc Af Amer 68 (*)    All other components within normal limits  URINALYSIS, ROUTINE W REFLEX MICROSCOPIC - Abnormal; Notable for the following:    APPearance CLOUDY (*)    Leukocytes, UA MODERATE (*)    All other components within normal limits  URINE MICROSCOPIC-ADD ON - Abnormal; Notable for the following:    Bacteria, UA MANY (*)    All other components within normal limits   Dg Chest 2 View  12/21/2011  *RADIOLOGY REPORT*  Clinical Data: Cough.  CHEST - 2 VIEW  Comparison: 11/20/2010  Findings: Calcified granuloma the right lung base.  Heart is borderline in size.  No acute opacities or effusions.  No acute bony abnormality.  IMPRESSION: Borderline heart size.  Old granulomatous disease.  No active disease.  Original Report Authenticated By: Cyndie Chime, M.D.   Dg Knee Complete 4 Views Right  12/21/2011  *RADIOLOGY REPORT*  Clinical Data: Knee pain.  No known injury.  RIGHT KNEE - COMPLETE 4+ VIEW  Comparison: None  Findings: Moderate  joint effusion.  Advanced degenerative joint disease changes.  Near complete joint space loss in the medial compartment. No acute bony abnormality.  Specifically, no fracture, subluxation, or  dislocation.  Soft tissues are intact.  IMPRESSION: Advanced degenerative changes.  Moderate joint effusion.  No acute findings.  Original Report Authenticated By: Cyndie Chime, M.D.   I personally reviewed the plain films.  1. Urinary tract infection       MDM  Pt with dementia presents from home with knee pain s/p fall. She apparently did not strike her head or suffer LOC. She is not on any anticoagulants. Plain film of knee unremarkable; she is slightly tender to palp over medial aspect of knee without gross effusion. Labs show UTI with WBC of 16k. Given this, her apparent confusion, and as she lives by herself, would feel more comfortable with admission for tx. I've discussed with Dr. Rito Ehrlich with [facility] who agrees to admit for management. Nephew is currently at bedside; findings and plan discussed with him; he was agreeable to plan.  Aarianna Hoadley, [place] 12/21/11 1248  Grant Fontana, [place] 12/21/11 1255

## 2011-12-21 NOTE — ED Notes (Signed)
Pt states that she slid on the floor while in the bathroom, rt knee pain, 20g iv lt fa, denies hitting her head, alert to person and talking, abrasion to rt fa, no deformity noted, lt hip surgery and normally sits  external

## 2011-12-21 NOTE — ED Provider Notes (Signed)
Medical screening examination/treatment/procedure(s) were conducted as a shared visit with non-physician practitioner(s) and myself.  I personally evaluated the patient during the encounter Patient with knee pain on exam but also has global confusion. She is talking about things that don't make sense at times but that also is lucid making sense at times. Concern for possible infection causing symptoms. Labs show the patient has a white blood cell count of 16,000 and a UA consistent with UTI.  Gwyneth Sprout, MD 12/21/11 [phone]

## 2011-12-21 NOTE — ED Notes (Signed)
Report called to Kirsten, RN

## 2011-12-21 NOTE — ED Notes (Signed)
NUU:[id number]<GU> Expected date:<BR> Expected time:<BR> Means of arrival:<BR> Comments:<BR> Per Charge

## 2011-12-21 NOTE — ED Notes (Signed)
Patient transported to X-ray 

## 2011-12-22 ENCOUNTER — Other Ambulatory Visit: Payer: Self-pay

## 2011-12-22 DIAGNOSIS — R001 Bradycardia, unspecified: Secondary | ICD-10-CM

## 2011-12-22 DIAGNOSIS — M1711 Unilateral primary osteoarthritis, right knee: Secondary | ICD-10-CM | POA: Diagnosis present

## 2011-12-22 LAB — COMPREHENSIVE METABOLIC PANEL
ALT: 25 U/L (ref 0–35)
CO2: 31 mEq/L (ref 19–32)
Calcium: 8.6 mg/dL (ref 8.4–10.5)
Creatinine, Ser: 1.24 mg/dL — ABNORMAL HIGH (ref 0.50–1.10)
GFR calc Af Amer: 45 mL/min — ABNORMAL LOW (ref >90)
GFR calc non Af Amer: 38 mL/min — ABNORMAL LOW (ref >90)
Glucose, Bld: 124 mg/dL — ABNORMAL HIGH (ref 70–99)
Sodium: 137 mEq/L (ref 135–145)

## 2011-12-22 LAB — URINE CULTURE
Colony Count: >=100000
Culture  Setup Time: 201303020142

## 2011-12-22 LAB — TSH: TSH: 4.702 u[IU]/mL — ABNORMAL HIGH (ref 0.350–4.500)

## 2011-12-22 LAB — CBC
Hemoglobin: 11.7 g/dL — ABNORMAL LOW (ref 12.0–15.0)
MCHC: 33.1 g/dL (ref 30.0–36.0)
RDW: 13.4 % (ref 11.5–15.5)

## 2011-12-22 LAB — T4, FREE: Free T4: 1.18 ng/dL (ref 0.80–1.80)

## 2011-12-22 MED ORDER — METHYLPREDNISOLONE ACETATE 40 MG/ML IJ SUSP
40.0000 mg | Freq: Once | INTRAMUSCULAR | Status: AC
  Administered 2011-12-22: 40 mg via INTRAMUSCULAR
  Filled 2011-12-22: qty 1

## 2011-12-22 MED ORDER — SODIUM CHLORIDE 0.9 % IV SOLN
INTRAVENOUS | Status: DC
  Administered 2011-12-22 – 2011-12-23 (×3): via INTRAVENOUS

## 2011-12-22 MED ORDER — ATORVASTATIN CALCIUM 10 MG PO TABS
20.0000 mg | ORAL_TABLET | Freq: Every day | ORAL | Status: DC
  Administered 2011-12-22 – 2011-12-23 (×2): 20 mg via ORAL
  Filled 2011-12-22 (×3): qty 2

## 2011-12-22 NOTE — Consult Note (Signed)
ORTHOPAEDIC CONSULTATION  REQUESTING PHYSICIAN: Osvaldo Shipper, MD  Chief Complaint: Right knee pain  HPI: Casey Vasquez is a 78 y.o. female who complains of  chronic right knee pain who is gotten to a point now where she has difficulty walking, severe pain with ambulation, and was recently admitted to the hospital. She herself is a very poor historian, and says that she does not know why she is here, but is simply aware that she has pain in both of her knees that makes it almost impossible for her to ambulate. She has had a left total knee replacement, although I know this by x-ray, and the patient does not know what she had done. She apparently had some type of fall recently, or at least was found down in the bathroom pain is currently rated as minimal while in bed, but with ambulation becomes severe, located around both knees, right greater than left.  Past Medical History  Diagnosis Date  . HTN (hypertension)   . Dementia   . Hypothyroid   . Neuropathy   . UTI (urinary tract infection)   . Chronic pain    Past Surgical History  Procedure Date  . Appendectomy   . Knee surgery   . Total hip arthroplasty    History   Social History  . Marital Status: Widowed    Spouse Name: N/A    Number of Children: N/A  . Years of Education: N/A   Social History Main Topics  . Smoking status: Never Smoker   . Smokeless tobacco: Never Used  . Alcohol Use: No  . Drug Use: None  . Sexually Active: No   Other Topics Concern  . None   Social History Narrative  . None   Family History  Problem Relation Age of Onset  . Hypertension     Allergies  Allergen Reactions  . Penicillins    Prior to Admission medications   Medication Sig Start Date End Date Taking? Authorizing Provider  amLODipine (NORVASC) 2.5 MG tablet Take 1 tablet (2.5 mg total) by mouth daily. 09/19/11 09/18/12 Yes Rollene Rotunda, MD  atenolol-chlorthalidone (TENORETIC) 50-25 MG per tablet Take 0.5 tablets by  mouth daily.  09/12/11  Yes Historical Provider, MD  atorvastatin (LIPITOR) 20 MG tablet Take 20 mg by mouth daily.  08/29/11  Yes Historical Provider, MD  benazepril (LOTENSIN) 40 MG tablet Take 1 tablet (40 mg total) by mouth daily. 07/11/11  Yes Rollene Rotunda, MD  calcium citrate-vitamin D (CITRACAL+D) 315-200 MG-UNIT per tablet Take 1 tablet by mouth 2 (two) times daily.     Yes Historical Provider, MD  cholecalciferol (VITAMIN D) 1000 UNITS tablet Take 1,000 Units by mouth daily.   Yes Historical Provider, MD  gabapentin (NEURONTIN) 300 MG capsule Take 900 mg by mouth at bedtime.    Yes Historical Provider, MD  levothyroxine (SYNTHROID, LEVOTHROID) 25 MCG tablet Take 25 mcg by mouth daily.     Yes Historical Provider, MD  potassium chloride SA (K-DUR,KLOR-CON) 20 MEQ tablet Take 20 mEq by mouth daily.    Yes Historical Provider, MD  zolpidem (AMBIEN) 5 MG tablet Take 5 mg by mouth at bedtime.    Yes Historical Provider, MD   Dg Chest 2 View  12/21/2011  *RADIOLOGY REPORT*  Clinical Data: Cough.  CHEST - 2 VIEW  Comparison: 11/20/2010  Findings: Calcified granuloma the right lung base.  Heart is borderline in size.  No acute opacities or effusions.  No acute bony abnormality.  IMPRESSION:  Borderline heart size.  Old granulomatous disease.  No active disease.  Original Report Authenticated By: Cyndie Chime, M.D.   Dg Knee Complete 4 Views Right  12/21/2011  *RADIOLOGY REPORT*  Clinical Data: Knee pain.  No known injury.  RIGHT KNEE - COMPLETE 4+ VIEW  Comparison: None  Findings: Moderate joint effusion.  Advanced degenerative joint disease changes.  Near complete joint space loss in the medial compartment. No acute bony abnormality.  Specifically, no fracture, subluxation, or dislocation.  Soft tissues are intact.  IMPRESSION: Advanced degenerative changes.  Moderate joint effusion.  No acute findings.  Original Report Authenticated By: Cyndie Chime, M.D.    Positive ROS: All other systems have  been reviewed and were otherwise negative with the exception of those mentioned in the HPI and as above.  Physical Exam: General: Alert, no acute distress Cardiovascular: No significant pedal edema Respiratory: No cyanosis, no use of accessory musculature GI: No organomegaly, abdomen is soft and non-tender Skin: No lesions in the area of chief complaint Neurologic: Sensation intact distally Psychiatric: Patient is pleasant although somewhat demented, and is not a very good historian. Lymphatic: No axillary or cervical lymphadenopathy  MUSCULOSKELETAL: Left knee has well-healed surgical scars, the right knee has special effusion with range of motion from 0-60. She has positive crepitance.  Assessment: Right knee osteoarthritis flare,  dementia, and multiple other coexisting comorbidities  Plan: This is a chronic problem which is certain to continue to limit her functional capacity. At some point she may get to the point where walking is not possible. She is fairly frail to consider knee arthroplasty. I have recommended an intra-articular aspiration and injection. I do not see any signs for sepsis, and I do not have any concerns in for gout. There is no redness or warmth. Therefore we'll plan to aspirate her knee and injected today, and hopefully this will provide some degree of symptomatic relief. I do not think that she needs any type of emergent surgical intervention, and of knee arthroplasty would be complex, and would certainly require discussion with all the family members as well as her medical team. This can be done on an outpatient basis if indicated. I plan to aspirate her knee today, and inject her, and then would be happy to see her in the office at any point in the future, possibly in the next couple of weeks.  Thank you for this consultation.  12/22/2011  9:56 AM  PATIENT:  Casey Vasquez    PRE-PROCEDURE DIAGNOSIS:  Right knee osteoarthritis  POST-OPERATIVE DIAGNOSIS:   Same  PROCEDURE:  Intra-articular aspiration and injection right knee  PROCEDURE DETAILS:  After informed verbal consent was obtained the superolateral portal was prepped with Betadine and an 18-gauge needle was used to aspirate 70 cc of normal-appearing joint fluid, and 4 cc of Xylocaine and 1 cc of Depo-Medrol was injected. She tolerated this well and a Band-Aid was placed.      Eulas Post, MD 12/22/2011 9:50 AM

## 2011-12-22 NOTE — Progress Notes (Signed)
PHARMACIST - PHYSICIAN COMMUNICATION  DESCRIPTION:   Patients on amlodipine and simvastatin >20mg /day have reported cases of rhabdomyolysis. Per P/T Policy, if simvastatin dose is > 20 mg, substitute atorvastatin (Lipitor) 1mg  for each 2mg  simvastatin and leave communication form advising MD of change.  Thank you.    If you have questions about this conversion, please contact the Pharmacy Department  []   [phone] )  [facility] []   [phone] )  [facility]  []   [phone] )  [facility] [x]   [phone] )  [facility]     .

## 2011-12-22 NOTE — Discharge Instructions (Signed)
Wear and Tear Disorders of the Knee (Arthritis, Osteoarthritis) Everyone will experience wear and tear injuries (arthritis, osteoarthritis) of the knee. These are the changes we all get as we age. They come from the joint stress of daily living. The amount of cartilage damage in your knee and your symptoms determine if you need surgery. Mild problems require approximately two months recovery time. More severe problems take several months to recover. With mild problems, your surgeon may find worn and rough cartilage surfaces. With severe changes, your surgeon may find cartilage that has completely worn away and exposed the bone. Loose bodies of bone and cartilage, bone spurs (excess bone growth), and injuries to the menisci (cushions between the large bones of your leg) are also common. All of these problems can cause pain. For a mild wear and tear problem, rough cartilage may simply need to be shaved and smoothed. For more severe problems with areas of exposed bone, your surgeon may use an instrument for roughing up the bone surfaces to stimulate new cartilage growth. Loose bodies are usually removed. Torn menisci may be trimmed or repaired. ABOUT THE ARTHROSCOPIC PROCEDURE Arthroscopy is a surgical technique. It allows your orthopedic surgeon to diagnose and treat your knee injury with accuracy. The surgeon looks into your knee through a small scope. The scope is like a small (pencil-sized) telescope. Arthroscopy is less invasive than open knee surgery. You can expect a more rapid recovery. After the procedure, you will be moved to a recovery area until most of the effects of the medication have worn off. Your caregiver will discuss the test results with you. RECOVERY The severity of the arthritis and the type of procedure performed will determine recovery time. Other important factors include age, physical condition, medical conditions, and the type of rehabilitation program. Strengthening your muscles after  arthroscopy helps guarantee a better recovery. Follow your caregiver's instructions. Use crutches, rest, elevate, ice, and do knee exercises as instructed. Your caregivers will help you and instruct you with exercises and other physical therapy required to regain your mobility, muscle strength, and functioning following surgery. Only take over-the-counter or prescription medicines for pain, discomfort, or fever as directed by your caregiver.  SEEK MEDICAL CARE IF:   There is increased bleeding (more than a small spot) from the wound.   You notice redness, swelling, or increasing pain in the wound.   Pus is coming from wound.   You develop an unexplained oral temperature above 102 F (38.9 C) , or as your caregiver suggests.   You notice a foul smell coming from the wound or dressing.   You have severe pain with motion of the knee.  SEEK IMMEDIATE MEDICAL CARE IF:   You develop a rash.   You have difficulty breathing.   You have any allergic problems.  MAKE SURE YOU:   Understand these instructions.   Will watch your condition.   Will get help right away if you are not doing well or get worse.  Document Released: 10/05/2000 Document Revised: 06/20/2011 Document Reviewed: 03/03/2008 [employer] Patient Information 2012 [employer]. 

## 2011-12-22 NOTE — Progress Notes (Signed)
PCP: Rudi Heap, MD, MD  Brief HPI:  This is 78 year old, Caucasian female, with a past medical history of dementia, PVCs, hypertension, hyperlipidemia, and neuropathy, who lives at home by herself, but her nephew lives close by. She was found on the morning of admission by her nephew in the bathroom. She was on the floor of the bathroom. She told him that she had slid down to the floor. There was no mention of any head injury or loss of consciousness. Patient is complaining of pain in the right knee area, which has been ongoing for a long time. Patient has dementia and has short-term memory loss and was unable to provide much history. She denied any chest pain or shortness of breath. Denied any nausea, vomiting, or abdominal pain. She has been seen by orthopedic surgeon, Dr. Dion Saucier in the past for the knee pain.   Past medical history:  Past Medical History   Diagnosis  Date   .  HTN (hypertension)    .  Dementia    .  Hypothyroid    .  Neuropathy    .  UTI (urinary tract infection)    .  Chronic pain      Consultants: Dr. Dion Saucier (Ortho)  Procedures: None so far  Subjective: Patient denies any pain. Denies any dizziness or lightheadedness. No Cp.  Objective: Vital signs in last 24 hours: Temp:  [97.3 F (36.3 C)-98.1 F (36.7 C)] 97.3 F (36.3 C) (03/02 0537) Pulse Rate:  [46-90] 46  (03/02 0902) Resp:  [15-17] 17  (03/02 0537) BP: (110-160)/(48-73) 110/48 mmHg (03/02 0902) SpO2:  [99 %-100 %] 99 % (03/02 0537) Weight:  [69.2 kg (152 lb 8.9 oz)] 69.2 kg (152 lb 8.9 oz) (03/02 0439) Weight change:  Last BM Date: 12/20/11  Intake/Output from previous day: 03/01 0701 - 03/02 0700 In: 200 [IV Piggyback:200] Out: 200 [Urine:200] Intake/Output this shift: Total I/O In: -  Out: 200 [Urine:200]  General appearance: alert, cooperative, appears stated age, distracted and no distress Head: Normocephalic, without obvious abnormality, atraumatic Resp: clear to auscultation  bilaterally Cardio: Bradycardic, no murmur, click, rub or gallop GI: soft, non-tender; bowel sounds normal; no masses,  no organomegaly Extremities: extremities normal, atraumatic, no cyanosis or edema Pulses: 2+ and symmetric Neurologic: Grossly normal  Lab Results:  Basename 12/22/11 0600 12/21/11 1051  WBC 10.3 16.2*  HGB 11.7* 13.4  HCT 35.4* 39.3  PLT 235 283   BMET  Basename 12/22/11 0600 12/21/11 1051  NA 137 141  K 3.8 3.5  CL 101 101  CO2 31 31  GLUCOSE 124* 104*  BUN 29* 25*  CREATININE 1.24* 0.87  CALCIUM 8.6 9.2  ALT 25 --    Studies/Results: Dg Chest 2 View  12/21/2011  *RADIOLOGY REPORT*  Clinical Data: Cough.  CHEST - 2 VIEW  Comparison: 11/20/2010  Findings: Calcified granuloma the right lung base.  Heart is borderline in size.  No acute opacities or effusions.  No acute bony abnormality.  IMPRESSION: Borderline heart size.  Old granulomatous disease.  No active disease.  Original Report Authenticated By: Cyndie Chime, M.D.   Dg Knee Complete 4 Views Right  12/21/2011  *RADIOLOGY REPORT*  Clinical Data: Knee pain.  No known injury.  RIGHT KNEE - COMPLETE 4+ VIEW  Comparison: None  Findings: Moderate joint effusion.  Advanced degenerative joint disease changes.  Near complete joint space loss in the medial compartment. No acute bony abnormality.  Specifically, no fracture, subluxation, or dislocation.  Soft  tissues are intact.  IMPRESSION: Advanced degenerative changes.  Moderate joint effusion.  No acute findings.  Original Report Authenticated By: Cyndie Chime, M.D.    Medications:  Scheduled:    . amLODipine  2.5 mg Oral Daily  . atorvastatin  20 mg Oral q1800  . benazepril  40 mg Oral Daily  . calcium-vitamin D  1 tablet Oral BID AC  . chlorthalidone  12.5 mg Oral Daily  . ciprofloxacin  400 mg Intravenous Q12H  . docusate sodium  100 mg Oral BID  . enoxaparin  40 mg Subcutaneous Q24H  . gabapentin  900 mg Oral QHS  . levothyroxine  25 mcg Oral  Daily  . methylPREDNISolone acetate  40 mg Intramuscular Once  . potassium chloride SA  20 mEq Oral Daily  . sodium chloride  3 mL Intravenous Q12H  . DISCONTD: tenormin  25 mg Oral Daily  . DISCONTD: atenolol-chlorthalidone  0.5 tablet Oral Daily  . DISCONTD: calcium citrate-vitamin D  1 tablet Oral BID  . DISCONTD: simvastatin  40 mg Oral QHS    Assessment/Plan:  Active Problems:  PVC's (premature ventricular contractions)  HTN (hypertension)  Urinary tract infection  Hyperlipidemia  Hypothyroidism  Neuropathy  Bradycardia  Right knee pain    #1 fall with right knee pain: X-rays have not shown any acute abnormalities. Await Dr. Shelba Flake input. I spoke to him yesterday. Patient denies pain currently. Await physical therapy and occupational therapy evaluation. Pain control.  #2 Bradycardia: Get EKG. Probably due to Atenolol. Will discontinue for now.  #3 urinary tract infection. Urine cultures will be followed up on. Patient on ciprofloxacin.   #4 Slightly elevated BUN/Creat: Give IVF. Recheck in AM. Could be mildly dehydrated.  #5 history of hypertension: Stable. Monitor closely.   #6 history of hypothyroidism. Continue with Synthroid. Check TSH and FT4 due to bradycardia.  #7 history of neuropathy: Continue with Neurontin.   #8 dementia: This is she appears to be stable. There may be a little bit more confusion than baseline due to the infection.   Code Status: Full Code for now. Discussed with Daughter yesterday.  Disposition: Await PT/OT. Will likely be able to go back home as long family is available to help.   LOS: 1 day   [facility] Pager [phone] 12/22/2011, 9:26 AM

## 2011-12-23 LAB — CBC
Hemoglobin: 11.8 g/dL — ABNORMAL LOW (ref 12.0–15.0)
MCHC: 33 g/dL (ref 30.0–36.0)
RDW: 13 % (ref 11.5–15.5)

## 2011-12-23 LAB — BASIC METABOLIC PANEL
GFR calc Af Amer: 64 mL/min — ABNORMAL LOW (ref >90)
GFR calc non Af Amer: 55 mL/min — ABNORMAL LOW (ref >90)
Potassium: 3.5 mEq/L (ref 3.5–5.1)
Sodium: 138 mEq/L (ref 135–145)

## 2011-12-23 MED ORDER — CIPROFLOXACIN HCL 500 MG PO TABS
500.0000 mg | ORAL_TABLET | Freq: Two times a day (BID) | ORAL | Status: DC
  Administered 2011-12-23 – 2011-12-24 (×2): 500 mg via ORAL
  Filled 2011-12-23 (×3): qty 1

## 2011-12-23 NOTE — Evaluation (Signed)
Physical Therapy Evaluation Patient Details Name: Casey Vasquez MRN: [id number] DOB: 11/13/1923 Today's Date: 12/23/2011  Problem List:  Patient Active Problem List  Diagnoses  . Edema  . PVC's (premature ventricular contractions)  . HTN (hypertension)  . Urinary tract infection  . Hyperlipidemia  . Hypothyroidism  . Neuropathy  . Bradycardia  . Osteoarthritis of right knee    Past Medical History:  Past Medical History  Diagnosis Date  . HTN (hypertension)   . Dementia   . Hypothyroid   . Neuropathy   . UTI (urinary tract infection)   . Chronic pain    Past Surgical History:  Past Surgical History  Procedure Date  . Appendectomy   . Knee surgery   . Total hip arthroplasty     PT Assessment/Plan/Recommendation PT Assessment Clinical Impression Statement: Pt admitted with Bil (R>L) knee pain and presents with improved pain control but impulsive behavior and questionable saftey awareness secondary to dementia.  Pt reports she lives alone - ? ability to live safely alone. PT Recommendation/Assessment: Patient will need skilled PT in the acute care venue PT Problem List: Decreased activity tolerance;Pain;Decreased cognition;Decreased knowledge of use of DME;Decreased safety awareness;Impaired sensation PT Therapy Diagnosis : Difficulty walking PT Plan PT Frequency: Min 3X/week PT Treatment/Interventions: DME instruction;Gait training;Stair training;Functional mobility training;Therapeutic activities;Cognitive remediation;Patient/family education PT Recommendation Recommendations for Other Services: OT consult Follow Up Recommendations: Home health PT (For home saftey eval.  24/7 assist at home 2* dementia/safte) Equipment Recommended: None recommended by PT PT Goals  Acute Rehab PT Goals PT Goal Formulation: With patient Time For Goal Achievement: 2 weeks Pt will go Sit to Stand: with modified independence PT Goal: Sit to Stand - Progress: Goal set today Pt will  go Stand to Sit: with modified independence PT Goal: Stand to Sit - Progress: Goal set today Pt will Ambulate: >150 feet;with supervision;with rolling walker PT Goal: Ambulate - Progress: Goal set today Pt will Go Up / Down Stairs: 3-5 stairs;with min assist;with least restrictive assistive device (4 step with rail and 1 step with no rail) PT Goal: Up/Down Stairs - Progress: Goal set today  PT Evaluation Precautions/Restrictions  Precautions Precautions: Fall Restrictions Other Position/Activity Restrictions: WBAT Prior Functioning  Home Living Lives With: Alone Receives Help From: Family Type of Home: House Home Layout: One level (2 levels but pt states does not go up stairs) Home Access: Stairs to enter Entrance Stairs-Rails: Right;Left Entrance Stairs-Number of Steps: 4 (4 to porch plus 1 into house) Home Adaptive Equipment: Walker - rolling Prior Function Level of Independence: Independent with basic ADLs;Requires assistive device for independence;Independent with transfers;Independent with gait Able to Take Stairs?:  (Pt states she uses RW at all times) Cognition Cognition Arousal/Alertness: Awake/alert Overall Cognitive Status: Impaired Memory: Appears impaired Memory Deficits: Pt repeats same requests multiple times during session Orientation Level: Oriented to person;Disoriented to place;Disoriented to time;Disoriented to situation Following Commands: Follows one step commands consistently Safety/Judgement: Decreased awareness of safety precautions Decreased Safety/Judgement: Impulsive;Decreased awareness of need for assistance Sensation/Coordination Coordination Gross Motor Movements are Fluid and Coordinated: Yes Extremity Assessment RUE Assessment RUE Assessment: Within Functional Limits LUE Assessment LUE Assessment: Within Functional Limits RLE Assessment RLE Assessment: Within Functional Limits LLE Assessment LLE Assessment: Within Functional  Limits Mobility (including Balance) Bed Mobility Bed Mobility: Yes Supine to Sit: 6: Modified independent (Device/Increase time) Sit to Supine: 6: Modified independent (Device/Increase time) Transfers Transfers: Yes Sit to Stand: 5: Supervision;4: Min assist Sit to Stand Details (indicate cue type and  reason): cues for use of UEs  Stand to Sit: 5: Supervision;4: Min assist Stand to Sit Details: cues for use of UEs  Ambulation/Gait Ambulation/Gait: Yes Ambulation/Gait Assistance: 5: Supervision;4: Min assist Ambulation/Gait Assistance Details (indicate cue type and reason): cues for position from RW and cues for sequence to reduce stress on R knee - followed briefly and reported as helpful and then forgotten Ambulation Distance (Feet): 120 Feet Assistive device: Rolling walker Gait Pattern: Step-to pattern;Step-through pattern    Exercise    End of Session PT - End of Session Equipment Utilized During Treatment: Gait belt Activity Tolerance: Patient tolerated treatment well;Patient limited by pain Patient left: in bed;with call bell in reach;with bed alarm set Nurse Communication: Mobility status for transfers;Mobility status for ambulation General Behavior During Session: [facility] for tasks performed Cognition: Impaired, at baseline  Casey Vasquez 12/23/2011, 1:46 PM

## 2011-12-23 NOTE — Progress Notes (Signed)
PCP: Rudi Heap, MD, MD  Brief HPI:  This is 78 year old, Caucasian female, with a past medical history of dementia, PVCs, hypertension, hyperlipidemia, and neuropathy, who lives at home by herself, but her nephew lives close by. She was found on the morning of admission by her nephew in the bathroom. She was on the floor of the bathroom. She told him that she had slid down to the floor. There was no mention of any head injury or loss of consciousness. Patient is complaining of pain in the right knee area, which has been ongoing for a long time. Patient has dementia and has short-term memory loss and was unable to provide much history. She denied any chest pain or shortness of breath. Denied any nausea, vomiting, or abdominal pain. She has been seen by orthopedic surgeon, Dr. Dion Saucier in the past for the knee pain.   Past medical history:  Past Medical History   Diagnosis  Date   .  HTN (hypertension)    .  Dementia    .  Hypothyroid    .  Neuropathy    .  UTI (urinary tract infection)    .  Chronic pain      Consultants: Dr. Dion Saucier (Ortho)  Procedures: None so far  Subjective: Patient denies any pain. Says she wants to go home. No family at bedside.  Objective: Vital signs in last 24 hours: Temp:  [97.5 F (36.4 C)-98.2 F (36.8 C)] 97.6 F (36.4 C) (03/03 0621) Pulse Rate:  [53-62] 58  (03/03 0621) Resp:  [18] 18  (03/03 0621) BP: (119-141)/(49-69) 123/64 mmHg (03/03 0947) SpO2:  [97 %-98 %] 98 % (03/03 0621) Weight change:  Last BM Date: 12/20/11  Intake/Output from previous day: 03/02 0701 - 03/03 0700 In: 1206.6 [I.V.:1206.6] Out: 200 [Urine:200] Intake/Output this shift:    General appearance: alert, cooperative, appears stated age, distracted and no distress Head: Normocephalic, without obvious abnormality, atraumatic Resp: clear to auscultation bilaterally Cardio: Bradycardic, no murmur, click, rub or gallop GI: soft, non-tender; bowel sounds normal; no  masses,  no organomegaly Extremities: extremities normal, atraumatic, no cyanosis or edema Pulses: 2+ and symmetric Neurologic: Grossly normal  Lab Results:  Basename 12/23/11 0600 12/22/11 0600  WBC 12.9* 10.3  HGB 11.8* 11.7*  HCT 35.8* 35.4*  PLT 250 235   BMET  Basename 12/23/11 0600 12/22/11 0600  NA 138 137  K 3.5 3.8  CL 106 101  CO2 27 31  GLUCOSE 147* 124*  BUN 24* 29*  CREATININE 0.92 1.24*  CALCIUM 8.8 8.6  ALT -- 25    Studies/Results: No results found.  Medications:  Scheduled:    . amLODipine  2.5 mg Oral Daily  . atorvastatin  20 mg Oral q1800  . benazepril  40 mg Oral Daily  . calcium-vitamin D  1 tablet Oral BID AC  . ciprofloxacin  400 mg Intravenous Q12H  . docusate sodium  100 mg Oral BID  . enoxaparin  40 mg Subcutaneous Q24H  . gabapentin  900 mg Oral QHS  . levothyroxine  25 mcg Oral Daily  . potassium chloride SA  20 mEq Oral Daily  . sodium chloride  3 mL Intravenous Q12H    Assessment/Plan:  Principal Problem:  *Osteoarthritis of right knee Active Problems:  PVC's (premature ventricular contractions)  HTN (hypertension)  Urinary tract infection  Hyperlipidemia  Hypothyroidism  Neuropathy  Bradycardia    #1 fall with right knee pain: Knee injected with steroids by Dr. Dion Saucier. Patient  denies pain currently. Await physical therapy and occupational therapy evaluation. Pain control.  #2 Bradycardia: HR improved. Sinus Huston Foley. Probably due to Atenolol which has been held.  #3 urinary tract infection. Urine cultures shows multiple organism. Cipro for 7 days total. Change to oral.  #4 Slightly elevated BUN/Creat: Improved with IVF. Saline lock.  #5 history of hypertension: Stable. Monitor closely.   #6 history of hypothyroidism. Continue with Synthroid. FT4 normal.  #7 history of neuropathy: Continue with Neurontin.   #8 dementia: This is she appears to be stable. There may be a little bit more confusion than baseline due to  the infection.   Code Status: Full Code for now. Discussed with Daughter.  Disposition: Await PT/OT. Will likely be able to go back home as long family is available to help. If not, may need to consider SNF.   LOS: 2 days   [facility] Pager [phone] 12/23/2011, 12:34 PM

## 2011-12-24 MED ORDER — CIPROFLOXACIN HCL 500 MG PO TABS: 500.0000 mg | ORAL_TABLET | Freq: Two times a day (BID) | ORAL | Status: AC

## 2011-12-24 MED ORDER — OXYCODONE HCL 5 MG PO TABS: 5.0000 mg | ORAL_TABLET | ORAL | Status: AC | PRN

## 2011-12-24 NOTE — Progress Notes (Signed)
Discharge instructions given to pt/family, verbalized understanding. Left the unit in stable condition. 

## 2011-12-24 NOTE — Discharge Summary (Signed)
Physician Discharge Summary  Patient ID: Casey Vasquez MRN: [id number] DOB/AGE: [birth year] 76 y.o.  Admit date: 12/21/2011 Discharge date: 12/24/2011  PCP: Rudi Heap, MD, MD  Discharge Diagnoses:  Principal Problem:  *Osteoarthritis of right knee Active Problems:  PVC's (premature ventricular contractions)  HTN (hypertension)  Urinary tract infection  Hyperlipidemia  Hypothyroidism  Neuropathy  Bradycardia  RECOMMENDATIONS TO PCP: 1. F/U BP. Atenolol held due to bradycardia. If BP remains high please consider alternative agent.  Discharged Condition: fair  Initial History: This is 78 year old, Caucasian female, with a past medical history of dementia, PVCs, hypertension, hyperlipidemia, and neuropathy, who lives at home by herself, but her nephew lives close by. She was found on the morning of admission by her nephew in the bathroom. She was on the floor of the bathroom. She told him that she had slid down to the floor. There was no mention of any head injury or loss of consciousness. Patient is complaining of pain in the right knee area, which has been ongoing for a long time. Patient has dementia and has short-term memory loss and was unable to provide much history. She denied any chest pain or shortness of breath. Denied any nausea, vomiting, or abdominal pain. She has been seen by orthopedic surgeon, Dr. Dion Saucier in the past for the knee pain.   Hospital Course:   #1 fall with right knee pain: The patient has severe arthritis of the right knee. Patient was seen by orthopedic surgeon, Dr. Dion Saucier and he injected the right knee with steroids. Patient's mobility has somewhat improved. Patient was seen by physical therapy and occupational therapy. They recommend home health PT will and 24-hour supervision. This has been discussed with the daughter, Lupita Leash and she said this has been arranged for the patient. We will arrange home health for her.   #2 Sinus Bradycardia: This is  thought to be secondary to the beta blockers that she was on. That has been discontinued. She will need to followup with her primary care physician for further management of hypertension.   #3 urinary tract infection. Urine cultures shows multiple organism. Cipro for 7 days total.   #4 Slightly elevated BUN/Creat: This improved with IV fluids. Hold off on diuretics for now.  #5 history of hypertension: Blood pressure is reasonably well controlled on the amlodipine as well as the ARB. Patient followup with primary care physician for blood pressure management. As mentioned above, the beta blocker and diuretics. Has been discontinued.  #6 history of hypothyroidism. Continue with Synthroid. FT4 normal.   #7 history of neuropathy: Continue with Neurontin.   #8 dementia: This is she appears to be stable. She is at her baseline.  Patient will be discharged today. She stable medically. Home health PT and OT will be arranged. All of the above have been discussed with the daughter.   PERTINENT LABS  Urine cultures grew multiple organisms. His TSH was 4.7, and his free T4 was 1.18.  IMAGING STUDIES Dg Chest 2 View  12/21/2011  *RADIOLOGY REPORT*  Clinical Data: Cough.  CHEST - 2 VIEW  Comparison: 11/20/2010  Findings: Calcified granuloma the right lung base.  Heart is borderline in size.  No acute opacities or effusions.  No acute bony abnormality.  IMPRESSION: Borderline heart size.  Old granulomatous disease.  No active disease.  Original Report Authenticated By: Cyndie Chime, M.D.   Dg Knee Complete 4 Views Right  12/21/2011  *RADIOLOGY REPORT*  Clinical Data: Knee pain.  No known injury.  RIGHT KNEE - COMPLETE 4+ VIEW  Comparison: None  Findings: Moderate joint effusion.  Advanced degenerative joint disease changes.  Near complete joint space loss in the medial compartment. No acute bony abnormality.  Specifically, no fracture, subluxation, or dislocation.  Soft tissues are intact.  IMPRESSION:  Advanced degenerative changes.  Moderate joint effusion.  No acute findings.  Original Report Authenticated By: Cyndie Chime, M.D.    Discharge Exam: Blood pressure 139/48, pulse 62, temperature 98.2 F (36.8 C), temperature source Oral, resp. rate 20, height 5\' 4"  (1.626 m), weight 69.2 kg (152 lb 8.9 oz), SpO2 100.00%. General appearance: alert, cooperative and no distress Head: Normocephalic, without obvious abnormality, atraumatic Resp: clear to auscultation bilaterally Cardio: regular rate and rhythm, S1, S2 normal, no murmur, click, rub or gallop GI: soft, non-tender; bowel sounds normal; no masses,  no organomegaly Extremities: Improved range of motion of the right knee has been noted  Disposition: 01-Home or Self Care  Discharge Orders    Future Appointments: Provider: Department: Dept Phone: Center:   02/27/2012 2:00 PM Rollene Rotunda, MD [facility] [phone] [facility]     Future Orders Please Complete By Expires   Diet - low sodium heart healthy      Increase activity slowly      Discharge instructions      Comments:   Be sure to follow up with Dr. Dion Saucier One of your blood pressure medication was held because of slow HR (Atenolol/Chlorthalidone). Please stop taking it. See your PCP regarding Bp issues.     Current Discharge Medication List    START taking these medications   Details  ciprofloxacin (CIPRO) 500 MG tablet Take 1 tablet (500 mg total) by mouth 2 (two) times daily. For 3 days Qty: 6 tablet, Refills: 0    oxyCODONE (OXY IR/ROXICODONE) 5 MG immediate release tablet Take 1 tablet (5 mg total) by mouth every 4 (four) hours as needed (for severe pain). Qty: 15 tablet, Refills: 0      CONTINUE these medications which have NOT CHANGED   Details  amLODipine (NORVASC) 2.5 MG tablet Take 1 tablet (2.5 mg total) by mouth daily. Qty: 30 tablet, Refills: 11    atorvastatin (LIPITOR) 20 MG tablet Take 20 mg by mouth daily.     benazepril (LOTENSIN) 40  MG tablet Take 1 tablet (40 mg total) by mouth daily. Qty: 30 tablet, Refills: 11   Associated Diagnoses: HTN (hypertension)    calcium citrate-vitamin D (CITRACAL+D) 315-200 MG-UNIT per tablet Take 1 tablet by mouth 2 (two) times daily.      cholecalciferol (VITAMIN D) 1000 UNITS tablet Take 1,000 Units by mouth daily.    gabapentin (NEURONTIN) 300 MG capsule Take 900 mg by mouth at bedtime.     levothyroxine (SYNTHROID, LEVOTHROID) 25 MCG tablet Take 25 mcg by mouth daily.      zolpidem (AMBIEN) 5 MG tablet Take 5 mg by mouth at bedtime.       STOP taking these medications     atenolol-chlorthalidone (TENORETIC) 50-25 MG per tablet      potassium chloride SA (K-DUR,KLOR-CON) 20 MEQ tablet        Follow-up Information    Follow up with Rudi Heap, MD. Schedule an appointment as soon as possible for a visit in 1 week. (for monitoring BP and post hospitalization follow up)       Follow up with Eulas Post, MD. Schedule an appointment as soon as possible for a visit in 2 weeks. (for  righ knee pain)    Contact information:   [facility] [address], [address] [place] [address] [phone]          Total Discharge Time: 35 mins  [facility] Pager [phone]  12/24/2011, 11:08 AM

## 2011-12-24 NOTE — Progress Notes (Signed)
12-24-11 Received call back from dtr, Casey Vasquez who is agreeable to [facility] for PT/OT services. Chose [facility]. Confirmed that patient will have her nephew stay with her and that she does have an aide that stays with her up to 5 hrs a day. Casey Vasquez also states that they plan on increasing the time of the personal aide assistance. Called Casey Vasquez ( nephew) at [phone] to make aware of dc plans and that [facility] will be contacting him. No dme is needed. PCP: Casey Vasquez. Casey Vasquez aware of referral for PT/OT services.   [place], [place]  [phone]

## 2011-12-24 NOTE — Progress Notes (Signed)
UR completed 

## 2011-12-24 NOTE — Evaluation (Signed)
Occupational Therapy Evaluation Patient Details Name: Casey Vasquez MRN: [id number] DOB: 1924-05-22 Today's Date: 12/24/2011  Problem List:  Patient Active Problem List  Diagnoses  . Edema  . PVC's (premature ventricular contractions)  . HTN (hypertension)  . Urinary tract infection  . Hyperlipidemia  . Hypothyroidism  . Neuropathy  . Bradycardia  . Osteoarthritis of right knee    Past Medical History:  Past Medical History  Diagnosis Date  . HTN (hypertension)   . Dementia   . Hypothyroid   . Neuropathy   . UTI (urinary tract infection)   . Chronic pain    Past Surgical History:  Past Surgical History  Procedure Date  . Appendectomy   . Knee surgery   . Total hip arthroplasty     OT Assessment/Plan/Recommendation OT Assessment Clinical Impression Statement: Pt will benefit from skilled OT services to increase her safety and independence with ADL for return home with 24 hour assist OT Recommendation/Assessment: Patient will need skilled OT in the acute care venue OT Problem List: Decreased strength;Decreased knowledge of use of DME or AE;Pain OT Therapy Diagnosis : Generalized weakness OT Plan OT Frequency: Min 2X/week OT Treatment/Interventions: Self-care/ADL training;Therapeutic activities;DME and/or AE instruction;Patient/family education OT Recommendation Follow Up Recommendations: Home health OT 24/7 assistance for safety due to decrease cognition. Equipment Recommended: None recommended by OT Individuals Consulted Consulted and Agree with Results and Recommendations: Patient unable/family or caregiver not available OT Goals Acute Rehab OT Goals OT Goal Formulation: With patient Time For Goal Achievement: 2 weeks ADL Goals Pt Will Perform Grooming: with supervision;Standing at sink ADL Goal: Grooming - Progress: Goal set today Pt Will Perform Lower Body Bathing: with supervision;Sit to stand from chair;Sit to stand from bed ADL Goal: Lower Body Bathing  - Progress: Goal set today Pt Will Perform Lower Body Dressing: with supervision;Sit to stand from bed;Sit to stand from chair ADL Goal: Lower Body Dressing - Progress: Goal set today Pt Will Transfer to Toilet: with supervision;Ambulation;Comfort height toilet;Grab bars ADL Goal: Toilet Transfer - Progress: Goal set today Pt Will Perform Toileting - Clothing Manipulation: with supervision;Standing ADL Goal: Toileting - Clothing Manipulation - Progress: Goal set today Pt Will Perform Toileting - Hygiene: with supervision;Sit to stand from 3-in-1/toilet ADL Goal: Toileting - Hygiene - Progress: Goal set today Pt Will Perform Tub/Shower Transfer: with supervision;Shower transfer;Grab bars;Other (comment) (built in seat) ADL Goal: Web designer - Progress: Goal set today Additional ADL Goal #1: Pt will gather all bathing/dressing items to perform her ADL with supervision and RW ADL Goal: Additional Goal #1 - Progress: Goal set today  OT Evaluation Precautions/Restrictions  Precautions Precautions: Fall Restrictions Weight Bearing Restrictions: No Other Position/Activity Restrictions: WBAT Prior Functioning Home Living Lives With: Alone Receives Help From: Family;Personal care attendant Type of Home: House Home Layout: One level (2 levels but pt states does not go up stairs) Home Access: Stairs to enter Entrance Stairs-Rails: Right;Left Entrance Stairs-Number of Steps: 4 (4 to porch plus 1 into house) Bathroom Shower/Tub: Walk-in shower;Other (comment) (grab bars) Bathroom Toilet: Handicapped height (grab bars) Home Adaptive Equipment: Walker - rolling;Grab bars in shower;Grab bars around toilet;Built-in shower seat Prior Function Level of Independence: Independent with basic ADLs;Requires assistive device for independence;Independent with transfers;Independent with gait;Needs assistance with homemaking Comments: per patient, she has a housekeeper to help with cleaning and a  personal aide to help with her shower 3X/week ADL ADL Eating/Feeding: Simulated;Independent Where Assessed - Eating/Feeding: Bed level Grooming: Simulated;Set up;Wash/dry hands Where Assessed - Grooming: Sitting,  bed;Unsupported Upper Body Bathing: Simulated;Chest;Right arm;Left arm;Abdomen;Supervision/safety;Set up Where Assessed - Upper Body Bathing: Sitting, bed;Unsupported Lower Body Bathing: Simulated;Minimal assistance Lower Body Bathing Details (indicate cue type and reason): min guard assist Where Assessed - Lower Body Bathing: Sit to stand from bed Upper Body Dressing: Simulated;Set up;Supervision/safety Where Assessed - Upper Body Dressing: Sitting, bed;Unsupported Lower Body Dressing: Simulated;Minimal assistance Lower Body Dressing Details (indicate cue type and reason): min guard assist Where Assessed - Lower Body Dressing: Sit to stand from bed Toilet Transfer: Performed;Minimal assistance Toilet Transfer Details (indicate cue type and reason): min guard assist with RW Toilet Transfer Method: Ambulating Toilet Transfer Equipment: Comfort height toilet;Grab bars Toileting - Clothing Manipulation: Simulated;Minimal assistance Toileting - Clothing Manipulation Details (indicate cue type and reason): min guard assist Where Assessed - Glass blower/designer Manipulation: Standing Toileting - Hygiene: Simulated;Minimal assistance Toileting - Hygiene Details (indicate cue type and reason): min guard assist Where Assessed - Toileting Hygiene: Standing Tub/Shower Transfer: Not assessed Tub/Shower Transfer Method: Not assessed Equipment Used: Rolling walker Vision/Perception  Vision - History Baseline Vision: Wears glasses all the time Cognition Cognition Arousal/Alertness: Awake/alert Overall Cognitive Status: Impaired Memory: Appears impaired Memory Deficits: pt reports conflicting information during session. Initially states her housekeeper helps with her meals too then later  states only the housework.  Orientation Level: Other (Comment);Disoriented X4 (unable to recall her birthdate too) Following Commands: Follows one step commands consistently Safety/Judgement: Decreased awareness of safety precautions Cognition - Other Comments: Pt not able to state birthdate, location or why she is in the hospital. Informed patient she is in the hospital and later patient able to state "hospital" when asked.  Sensation/Coordination Sensation Light Touch: Appears Intact Extremity Assessment RUE Assessment RUE Assessment: Within Functional Limits LUE Assessment LUE Assessment: Within Functional Limits Mobility  Bed Mobility Bed Mobility: Yes Supine to Sit: 6: Modified independent (Device/Increase time) Sit to Supine: 6: Modified independent (Device/Increase time) Transfers Sit to Stand: 4: Min assist;From bed;From toilet Sit to Stand Details (indicate cue type and reason): min guard assist Stand to Sit: 4: Min assist;To bed;To toilet Stand to Sit Details: min guard assist Exercises   End of Session OT - End of Session Activity Tolerance: Patient tolerated treatment well Patient left: in bed;with bed alarm set General Behavior During Session: [facility] for tasks performed Cognition: Impaired, at baseline   [place]enn[place] Laity [phone] 12/24/2011, 10:41 AM

## 2011-12-24 NOTE — Progress Notes (Signed)
12-24-11 Spoke with Mrs Bookbinder at bedside. She states she lives alone but has aide assistance. Pleasantly declines this CM setting up [facility] services. Mrs Bonds gave me permission to call her dtr Lupita Leash at [phone] to discuss dc plans especially the need for 24/7 supervision at home and for [facility] services to be set up. Left vm for Lupita Leash to request that she call this CM back regarding setting up [facility] and if 24/7 assistance not provided than maybe short term SNF may be appropriate per therapist recommendations. Will await return call from dtr.   [place], [place] [phone]

## 2012-02-27 ENCOUNTER — Encounter: Payer: Self-pay | Admitting: Cardiology

## 2012-02-27 ENCOUNTER — Ambulatory Visit (INDEPENDENT_AMBULATORY_CARE_PROVIDER_SITE_OTHER): Payer: Medicare Other | Admitting: Cardiology

## 2012-02-27 VITALS — BP 160/80 | HR 72 | Ht 60.0 in | Wt 167.0 lb

## 2012-02-27 DIAGNOSIS — I4949 Other premature depolarization: Secondary | ICD-10-CM

## 2012-02-27 DIAGNOSIS — I10 Essential (primary) hypertension: Secondary | ICD-10-CM

## 2012-02-27 DIAGNOSIS — R001 Bradycardia, unspecified: Secondary | ICD-10-CM

## 2012-02-27 DIAGNOSIS — R609 Edema, unspecified: Secondary | ICD-10-CM

## 2012-02-27 DIAGNOSIS — I498 Other specified cardiac arrhythmias: Secondary | ICD-10-CM

## 2012-02-27 DIAGNOSIS — I493 Ventricular premature depolarization: Secondary | ICD-10-CM

## 2012-02-27 MED ORDER — AMLODIPINE BESYLATE 5 MG PO TABS: 5.0000 mg | ORAL_TABLET | Freq: Every day | ORAL | Status: DC

## 2012-02-27 NOTE — Assessment & Plan Note (Signed)
We discussed conservative therapy particularly keeping her feet elevated.

## 2012-02-27 NOTE — Patient Instructions (Signed)
Please increase Norvasc to 5 mg a day Continue all other medications as listed  Please obtain a blood pressure monitor.  Follow up in 3 months with Dr Antoine Poche

## 2012-02-27 NOTE — Assessment & Plan Note (Signed)
She is not particularly bothered by palpitations. No change in therapy is indicated.

## 2012-02-27 NOTE — Progress Notes (Signed)
   HPI The patient presents for evaluation of edema.  Since I last saw her she was hospitalized with knee pain. This was treated with an injection. She was apparently bradycardic and I have reviewed the hospital records. Her beta blocker was discontinued. She still limited by knee pain but gets around with a walker. She does not notice any new shortness of breath though she is chronically dyspneic with moderate exertion. She's not describing PND or orthopnea. She's not describing chest pressure, neck or arm discomfort. She's really noticing any palpitations and has no presyncope or syncope. He does have continued chronic lower extremity swelling which has been described before. She keeps her feet on the ground all the time ago.   Allergies  Allergen Reactions  . Penicillins     Current Outpatient Prescriptions  Medication Sig Dispense Refill  . amLODipine (NORVASC) 2.5 MG tablet Take 1 tablet (2.5 mg total) by mouth daily.  30 tablet  11  . atorvastatin (LIPITOR) 20 MG tablet Take 20 mg by mouth daily.       . benazepril (LOTENSIN) 40 MG tablet Take 1 tablet (40 mg total) by mouth daily.  30 tablet  11  . calcium citrate-vitamin D (CITRACAL+D) 315-200 MG-UNIT per tablet Take 1 tablet by mouth 2 (two) times daily.        . cholecalciferol (VITAMIN D) 1000 UNITS tablet Take 1,000 Units by mouth daily.      Marland Kitchen gabapentin (NEURONTIN) 300 MG capsule Take 900 mg by mouth at bedtime.       Marland Kitchen levothyroxine (SYNTHROID, LEVOTHROID) 25 MCG tablet Take 25 mcg by mouth daily.          Past Medical History  Diagnosis Date  . HTN (hypertension)   . Dementia   . Hypothyroid   . Neuropathy   . UTI (urinary tract infection)   . Chronic pain     Past Surgical History  Procedure Date  . Appendectomy   . Knee surgery   . Total hip arthroplasty     ROS:  As stated in the HPI and negative for all other systems.  PHYSICAL EXAM BP 160/80  Pulse 72  Ht 5' (1.524 m)  Wt 167 lb (75.751 kg)  BMI 32.62  kg/m2 PHYSICAL EXAM GEN:  No distress NECK:  No jugular venous distention at 90 degrees, waveform within normal limits, carotid upstroke brisk and symmetric, no bruits, no thyromegaly LYMPHATICS:  No cervical adenopathy LUNGS:  Clear to auscultation bilaterally BACK:  No CVA tenderness CHEST:  Unremarkable HEART:  S1 and S2 within normal limits, no S3, no S4, no clicks, no rubs, no murmurs ABD:  Positive bowel sounds normal in frequency in pitch, no bruits, no rebound, no guarding, unable to assess midline mass or bruit with the patient seated. EXT:  2 plus pulses throughout, moderate edema, no cyanosis no clubbing SKIN:  No rashes no nodules NEURO:  Cranial nerves II through XII grossly intact, motor grossly intact throughout PSYCH:  Cognitively intact, oriented to person place and time   ASSESSMENT AND PLAN

## 2012-02-27 NOTE — Assessment & Plan Note (Signed)
She was noted to have this in the hospital and she will remain off the beta blocker.

## 2012-02-27 NOTE — Assessment & Plan Note (Signed)
She needs better blood pressure control increasing her Norvasc to 5 mg daily.

## 2012-06-18 ENCOUNTER — Ambulatory Visit (INDEPENDENT_AMBULATORY_CARE_PROVIDER_SITE_OTHER): Payer: Medicare Other | Admitting: Cardiology

## 2012-06-18 ENCOUNTER — Encounter: Payer: Self-pay | Admitting: Cardiology

## 2012-06-18 VITALS — BP 140/70 | HR 64 | Wt 163.0 lb

## 2012-06-18 DIAGNOSIS — I10 Essential (primary) hypertension: Secondary | ICD-10-CM

## 2012-06-18 DIAGNOSIS — E785 Hyperlipidemia, unspecified: Secondary | ICD-10-CM

## 2012-06-18 DIAGNOSIS — I4949 Other premature depolarization: Secondary | ICD-10-CM

## 2012-06-18 DIAGNOSIS — I493 Ventricular premature depolarization: Secondary | ICD-10-CM

## 2012-06-18 NOTE — Progress Notes (Signed)
    HPI The patient presents for evaluation of edema.  Since I last saw her her family saw her an automatic lift chair. She's keeping her feet elevated much of the time at home. She says this has made all the difference with her leg swelling. She's no longer bothered by this. She's not having any new shortness of breath, PND or orthopnea. She's not had any chest pressure, neck or arm discomfort. She has no new palpitations, presyncope or syncope. She does have a "queer" feeling in her head   Allergies  Allergen Reactions  . Penicillins     Current Outpatient Prescriptions  Medication Sig Dispense Refill  . amLODipine (NORVASC) 5 MG tablet Take 1 tablet (5 mg total) by mouth daily.  30 tablet  11  . atorvastatin (LIPITOR) 20 MG tablet Take 20 mg by mouth daily.       . benazepril (LOTENSIN) 40 MG tablet Take 1 tablet (40 mg total) by mouth daily.  30 tablet  11  . calcium citrate-vitamin D (CITRACAL+D) 315-200 MG-UNIT per tablet Take 1 tablet by mouth 2 (two) times daily.        . cholecalciferol (VITAMIN D) 1000 UNITS tablet Take 1,000 Units by mouth daily.      Marland Kitchen gabapentin (NEURONTIN) 300 MG capsule Take 900 mg by mouth at bedtime.       Marland Kitchen levothyroxine (SYNTHROID, LEVOTHROID) 25 MCG tablet Take 25 mcg by mouth daily.          Past Medical History  Diagnosis Date  . HTN (hypertension)   . Dementia   . Hypothyroid   . Neuropathy   . UTI (urinary tract infection)   . Chronic pain     Past Surgical History  Procedure Date  . Appendectomy   . Knee surgery   . Total hip arthroplasty     ROS:  As stated in the HPI and negative for all other systems.  PHYSICAL EXAM BP 140/70  Pulse 64  Wt 163 lb (73.936 kg) PHYSICAL EXAM GEN:  No distress NECK:  No jugular venous distention at 90 degrees, waveform within normal limits, carotid upstroke brisk and symmetric, no bruits, no thyromegaly LYMPHATICS:  No cervical adenopathy LUNGS:  Clear to auscultation bilaterally BACK:  No CVA  tenderness CHEST:  Unremarkable HEART:  S1 and S2 within normal limits, no S3, no S4, no clicks, no rubs, no murmurs ABD:  Positive bowel sounds normal in frequency in pitch, no bruits, no rebound, no guarding, unable to assess midline mass or bruit with the patient seated. EXT:  2 plus pulses throughout, trace edema, no cyanosis no clubbing  EKG:  Sinus rhythm, rate 64, axis within normal limits, intervals within normal limits, no acute ST-T wave changes. 06/18/2012   ASSESSMENT AND PLAN   Bradycardia -  She was noted to have this in the hospital and she will remain off the beta blocker.  She's not having any symptomatic dysrhythmias.  Edema -  This is much improved with her lift chair. No change in therapy is indicated.  HTN (hypertension) -  I did increase her Norvasc at the last visit but she's not keeping her blood pressure at home. No change in therapy is indicated.  PVC's (premature ventricular contractions) -  She is not particularly bothered by palpitations. No change in therapy is indicated.

## 2012-06-18 NOTE — Patient Instructions (Addendum)
The current medical regimen is effective;  continue present plan and medications.  Follow up as needed 

## 2012-07-06 ENCOUNTER — Other Ambulatory Visit: Payer: Self-pay | Admitting: Cardiology

## 2012-07-07 NOTE — Telephone Encounter (Signed)
..   Requested Prescriptions   Pending Prescriptions Disp Refills  . benazepril (LOTENSIN) 40 MG tablet [Pharmacy Med Name: BENAZEPRIL 40MG      TAB] 30 tablet 10    Sig: TAKE ONE TABLET BY MOUTH EVERY DAY

## 2013-01-07 ENCOUNTER — Encounter (HOSPITAL_COMMUNITY): Payer: Self-pay | Admitting: *Deleted

## 2013-01-07 ENCOUNTER — Telehealth: Payer: Self-pay | Admitting: Nurse Practitioner

## 2013-01-07 ENCOUNTER — Emergency Department (HOSPITAL_COMMUNITY)
Admission: EM | Admit: 2013-01-07 | Discharge: 2013-01-07 | Disposition: A | Discharge status: Home / Self Care | Payer: Medicare Other | Attending: Emergency Medicine | Admitting: Emergency Medicine

## 2013-01-07 ENCOUNTER — Emergency Department (HOSPITAL_COMMUNITY): Payer: Medicare Other

## 2013-01-07 DIAGNOSIS — E039 Hypothyroidism, unspecified: Secondary | ICD-10-CM | POA: Insufficient documentation

## 2013-01-07 DIAGNOSIS — Z87440 Personal history of urinary (tract) infections: Secondary | ICD-10-CM | POA: Insufficient documentation

## 2013-01-07 DIAGNOSIS — R5381 Other malaise: Secondary | ICD-10-CM | POA: Insufficient documentation

## 2013-01-07 DIAGNOSIS — G8929 Other chronic pain: Secondary | ICD-10-CM | POA: Insufficient documentation

## 2013-01-07 DIAGNOSIS — Z79899 Other long term (current) drug therapy: Secondary | ICD-10-CM | POA: Insufficient documentation

## 2013-01-07 DIAGNOSIS — F0390 Unspecified dementia without behavioral disturbance: Secondary | ICD-10-CM | POA: Insufficient documentation

## 2013-01-07 DIAGNOSIS — I10 Essential (primary) hypertension: Secondary | ICD-10-CM | POA: Insufficient documentation

## 2013-01-07 DIAGNOSIS — G609 Hereditary and idiopathic neuropathy, unspecified: Secondary | ICD-10-CM | POA: Insufficient documentation

## 2013-01-07 LAB — URINALYSIS, ROUTINE W REFLEX MICROSCOPIC
Glucose, UA: NEGATIVE mg/dL
Hgb urine dipstick: NEGATIVE
Specific Gravity, Urine: 1.013 (ref 1.005–1.030)
Urobilinogen, UA: 0.2 mg/dL (ref 0.0–1.0)
pH: 7 (ref 5.0–8.0)

## 2013-01-07 LAB — URINE MICROSCOPIC-ADD ON

## 2013-01-07 LAB — CBC WITH DIFFERENTIAL/PLATELET
HCT: 40.4 % (ref 36.0–46.0)
Hemoglobin: 13.1 g/dL (ref 12.0–15.0)
Lymphocytes Relative: 26 % (ref 12–46)
Lymphs Abs: 2.2 10*3/uL (ref 0.7–4.0)
Monocytes Relative: 9 % (ref 3–12)
Neutro Abs: 4.8 10*3/uL (ref 1.7–7.7)
Neutrophils Relative %: 57 % (ref 43–77)
RBC: 4.51 MIL/uL (ref 3.87–5.11)

## 2013-01-07 LAB — POCT I-STAT TROPONIN I

## 2013-01-07 LAB — POCT I-STAT, CHEM 8
BUN: 30 mg/dL — ABNORMAL HIGH (ref 6–23)
Chloride: 104 mEq/L (ref 96–112)
Creatinine, Ser: 1 mg/dL (ref 0.50–1.10)
Hemoglobin: 13.3 g/dL (ref 12.0–15.0)
Potassium: 3.8 mEq/L (ref 3.5–5.1)
Sodium: 143 mEq/L (ref 135–145)

## 2013-01-07 NOTE — ED Notes (Signed)
Per EMS pt from home and her home caregiver called because the pt was not acting like herself at home and holding her chest. The pt said that she was having moments where she felt like she just could not catch her breath. Per EMS VSS, pt alert and oriented, EKG showed NSR.

## 2013-01-07 NOTE — Telephone Encounter (Signed)
Patient complained of episodes of chest pain and dizziness.  Denies any cold symptoms.  Instructed her to call EMS or take her to ED for cardiac evaluation.  Patient/Caregiver agreed to plan.

## 2013-01-07 NOTE — ED Notes (Signed)
Patient transported to CT 

## 2013-01-07 NOTE — ED Notes (Signed)
ZOX:[id number]<AV> Expected date:<BR> Expected time:<BR> Means of arrival:<BR> Comments:<BR> 79yo not feeling right

## 2013-01-07 NOTE — Telephone Encounter (Signed)
NTBS today. Not feeling well

## 2013-01-07 NOTE — ED Provider Notes (Signed)
History     CSN: [id number]  Arrival date & time 01/07/13  1410   First MD Initiated Contact with Patient 01/07/13 1513      Chief Complaint  Patient presents with  . Shortness of Breath    (Consider location/radiation/quality/duration/timing/severity/associated sxs/prior treatment) HPI Level 5 caveat due to dementia Patient presents with mild confusion. She has baseline dementia. Patient states she's not sure why she is here. She reportedly had been complaining of shortness of breath. No fevers. She has been eating at her baseline. She has reportedly had episodes of double vision, but this is not unusual for her. No headache. Family members are concerned about urinary tract infection. Past Medical History  Diagnosis Date  . HTN (hypertension)   . Dementia   . Hypothyroid   . Neuropathy   . UTI (urinary tract infection)   . Chronic pain     Past Surgical History  Procedure Laterality Date  . Appendectomy    . Knee surgery    . Total hip arthroplasty      Family History  Problem Relation Age of Onset  . Hypertension      History  Substance Use Topics  . Smoking status: Never Smoker   . Smokeless tobacco: Never Used  . Alcohol Use: No    OB History   Grav Para Term Preterm Abortions TAB SAB Ect Mult Living                  Review of Systems  Unable to perform ROS: Dementia    Allergies  Penicillins  Home Medications   Current Outpatient Rx  Name  Route  Sig  Dispense  Refill  . amLODipine (NORVASC) 5 MG tablet   Oral   Take 1 tablet (5 mg total) by mouth daily.   30 tablet   11   . atorvastatin (LIPITOR) 20 MG tablet   Oral   Take 20 mg by mouth daily.          . benazepril (LOTENSIN) 40 MG tablet   Oral   Take 40 mg by mouth daily.         . calcium citrate-vitamin D (CITRACAL+D) 315-200 MG-UNIT per tablet   Oral   Take 1 tablet by mouth 2 (two) times daily.           . cholecalciferol (VITAMIN D) 1000 UNITS tablet   Oral   Take  1,000 Units by mouth daily.         . furosemide (LASIX) 20 MG tablet   Oral   Take 20 mg by mouth daily as needed (for fluid.).         Marland Kitchen gabapentin (NEURONTIN) 300 MG capsule   Oral   Take 300 mg by mouth at bedtime.          Marland Kitchen levothyroxine (SYNTHROID, LEVOTHROID) 25 MCG tablet   Oral   Take 25 mcg by mouth daily.             BP 174/74  Pulse 72  Temp(Src) 98 F (36.7 C) (Oral)  Resp 20  SpO2 99%  Physical Exam  Nursing note and vitals reviewed. Constitutional: She appears well-developed and well-nourished.  HENT:  Head: Normocephalic and atraumatic.  Eyes: EOM are normal. Pupils are equal, round, and reactive to light.  Neck: Normal range of motion. Neck supple.  Cardiovascular: Normal rate, regular rhythm and normal heart sounds.   No murmur heard. Pulmonary/Chest: Effort normal and breath sounds normal. No respiratory  distress. She has no wheezes. She has no rales.  Abdominal: Soft. Bowel sounds are normal. She exhibits no distension. There is no tenderness. There is no rebound and no guarding.  Musculoskeletal: Normal range of motion.  Neurological: She is alert. No cranial nerve deficit.  Mild confusion with baseline dementia  Skin: Skin is warm and dry.  Psychiatric: She has a normal mood and affect. Her speech is normal.    ED Course  Procedures (including critical care time)  Labs Reviewed  CBC WITH DIFFERENTIAL - Abnormal; Notable for the following:    Eosinophils Relative 8 (*)    All other components within normal limits  URINALYSIS, ROUTINE W REFLEX MICROSCOPIC - Abnormal; Notable for the following:    APPearance CLOUDY (*)    Leukocytes, UA TRACE (*)    All other components within normal limits  POCT I-STAT, CHEM 8 - Abnormal; Notable for the following:    BUN 30 (*)    Glucose, Bld 106 (*)    Calcium, Ion 1.12 (*)    All other components within normal limits  URINE MICROSCOPIC-ADD ON  POCT I-STAT TROPONIN I   Dg Chest 2  View  01/07/2013  *RADIOLOGY REPORT*  Clinical Data: 79 year old female with altered mental status and weakness.  CHEST - 2 VIEW  Comparison: 12/21/2011 and prior radiographs dating back to 02/24/2010  Findings: Cardiomegaly is noted. Calcified granulomas within the lungs again noted.  There is no evidence of focal airspace disease, pulmonary edema, suspicious pulmonary nodule/mass, pleural effusion, or pneumothorax. No acute bony abnormalities are identified.  IMPRESSION: Cardiomegaly without evidence of acute cardiopulmonary disease.   Original Report Authenticated By: Harmon Pier, M.D.    Ct Head Wo Contrast  01/07/2013  *RADIOLOGY REPORT*  Clinical Data: Altered mental status. Dementia.  Hypertensive.  CT HEAD WITHOUT CONTRAST  Technique:  Contiguous axial images were obtained from the base of the skull through the vertex without contrast.  Comparison: 11/20/2010  Findings: No intracranial hemorrhage.  Small vessel disease type changes without CT evidence of large acute infarct.  Vascular calcifications.  Global atrophy without hydrocephalus.  No intracranial mass lesion detected on this unenhanced exam.  Orbital structures unremarkable.  Mastoid air cells, middle ear cavities and visualized paranasal sinuses are clear.  IMPRESSION: No acute abnormality.  Please see above   Original Report Authenticated By: Lacy Duverney, M.D.      1. Fatigue     Date: 01/07/2013  Rate: 60  Rhythm: normal sinus rhythm  QRS Axis: normal  Intervals: normal  ST/T Wave abnormalities: normal  Conduction Disutrbances:none  Narrative Interpretation:   Old EKG Reviewed: unchanged     MDM  Patient presents with possible generalized weakness and occasional shortness of breath. Laboratory reassuring. BUN is mildly elevated. She will drink more oral fluids at home. She is near her baseline per family member. She will be discharged home.        Juliet Rude. Rubin Payor, MD 01/07/13 [phone]

## 2013-01-12 ENCOUNTER — Encounter: Payer: Self-pay | Admitting: Nurse Practitioner

## 2013-01-12 ENCOUNTER — Ambulatory Visit (INDEPENDENT_AMBULATORY_CARE_PROVIDER_SITE_OTHER): Payer: Medicare Other | Admitting: Nurse Practitioner

## 2013-01-12 VITALS — BP 142/78 | HR 67 | Temp 97.6°F

## 2013-01-12 DIAGNOSIS — E785 Hyperlipidemia, unspecified: Secondary | ICD-10-CM

## 2013-01-12 DIAGNOSIS — E876 Hypokalemia: Secondary | ICD-10-CM

## 2013-01-12 DIAGNOSIS — R609 Edema, unspecified: Secondary | ICD-10-CM

## 2013-01-12 DIAGNOSIS — I10 Essential (primary) hypertension: Secondary | ICD-10-CM

## 2013-01-12 DIAGNOSIS — R600 Localized edema: Secondary | ICD-10-CM

## 2013-01-12 DIAGNOSIS — I4891 Unspecified atrial fibrillation: Secondary | ICD-10-CM

## 2013-01-12 DIAGNOSIS — G609 Hereditary and idiopathic neuropathy, unspecified: Secondary | ICD-10-CM

## 2013-01-12 DIAGNOSIS — E039 Hypothyroidism, unspecified: Secondary | ICD-10-CM

## 2013-01-12 DIAGNOSIS — G4700 Insomnia, unspecified: Secondary | ICD-10-CM

## 2013-01-12 LAB — COMPLETE METABOLIC PANEL WITH GFR
Alkaline Phosphatase: 69 U/L (ref 39–117)
BUN: 26 mg/dL — ABNORMAL HIGH (ref 6–23)
GFR, Est Non African American: 48 mL/min — ABNORMAL LOW
Glucose, Bld: 122 mg/dL — ABNORMAL HIGH (ref 70–99)
Sodium: 141 mEq/L (ref 135–145)
Total Bilirubin: 0.8 mg/dL (ref 0.3–1.2)
Total Protein: 7 g/dL (ref 6.0–8.3)

## 2013-01-12 MED ORDER — FUROSEMIDE 20 MG PO TABS: 20.0000 mg | ORAL_TABLET | Freq: Every day | ORAL | Status: DC | PRN

## 2013-01-12 MED ORDER — AMLODIPINE BESYLATE 5 MG PO TABS: 5.0000 mg | ORAL_TABLET | Freq: Every day | ORAL | Status: DC

## 2013-01-12 MED ORDER — TRAZODONE HCL 50 MG PO TABS: 50.0000 mg | ORAL_TABLET | Freq: Every evening | ORAL | Status: DC | PRN

## 2013-01-12 MED ORDER — LEVOTHYROXINE SODIUM 25 MCG PO TABS: 25.0000 ug | ORAL_TABLET | Freq: Every day | ORAL | Status: DC

## 2013-01-12 MED ORDER — GABAPENTIN 300 MG PO CAPS: 300.0000 mg | ORAL_CAPSULE | Freq: Every day | ORAL | Status: DC

## 2013-01-12 MED ORDER — ATORVASTATIN CALCIUM 20 MG PO TABS: 20.0000 mg | ORAL_TABLET | Freq: Every day | ORAL | Status: DC

## 2013-01-12 MED ORDER — BENAZEPRIL HCL 40 MG PO TABS: 40.0000 mg | ORAL_TABLET | Freq: Every day | ORAL | Status: AC

## 2013-01-12 NOTE — Patient Instructions (Signed)
.   Atrial fibrillation  2. Essential hypertension, benign - COMPLETE METABOLIC PANEL WITH GFR - benazepril (LOTENSIN) 40 MG tablet; Take 1 tablet (40 mg total) by mouth daily.  Dispense: 30 tablet; Refill: 5 - amLODipine (NORVASC) 5 MG tablet; Take 1 tablet (5 mg total) by mouth daily.  Dispense: 30 tablet; Refill: 11  3. Other and unspecified hyperlipidemia - NMR Lipoprofile with Lipids - atorvastatin (LIPITOR) 20 MG tablet; Take 1 tablet (20 mg total) by mouth daily.  Dispense: 30 tablet; Refill: 5  4. Hypokalemia   5. Unspecified hypothyroidism - Thyroid Panel With TSH - levothyroxine (SYNTHROID, LEVOTHROID) 25 MCG tablet; Take 1 tablet (25 mcg total) by mouth daily.  Dispense: 30 tablet; Refill: 5  6. Insomnia - traZODone (DESYREL) 50 MG tablet; Take 1 tablet (50 mg total) by mouth at bedtime as needed for sleep.  Dispense: 30 tablet; Refill: 3  7. Unspecified hereditary and idiopathic peripheral neuropathy - gabapentin (NEURONTIN) 300 MG capsule; Take 1 capsule (300 mg total) by mouth at bedtime.  Dispense: 30 capsule; Refill: 5  Peripheral edema  - furosemide (LASIX) 20 MG tablet; Take 1 tablet (20 mg total) by mouth daily as needed (for fluid.).  Dispense: 30 tablet; Refill: 5  Continue all meds  Labs pending Diet discussed Bedtime ritual

## 2013-01-12 NOTE — Progress Notes (Signed)
Subjective:    Patient ID: Casey Vasquez, female    DOB: 12/13/1923, 79 y.o.   MRN: [id number]  Hyperlipidemia This is a chronic problem. The current episode started more than 1 year ago. The problem is controlled. Recent lipid tests were reviewed and are normal. Exacerbating diseases include obesity. There are no known factors aggravating her hyperlipidemia. Pertinent negatives include no chest pain, myalgias or shortness of breath. Current antihyperlipidemic treatment includes statins. The current treatment provides moderate improvement of lipids. There are no compliance problems.  Risk factors for coronary artery disease include hypertension.  Hypertension This is a chronic problem. The current episode started more than 1 year ago. The problem is unchanged. The problem is controlled. Associated symptoms include peripheral edema. Pertinent negatives include no chest pain, palpitations or shortness of breath. There are no associated agents to hypertension. Risk factors for coronary artery disease include dyslipidemia. Past treatments include ACE inhibitors and calcium channel blockers. The current treatment provides moderate improvement. Compliance problems include diet.    HYPOTHYROIDISM No c/o fatigue. Meds doing well  Foot Neuropathy Gabapentin 300 3 QHS. Only C/O pain when someone is touchigh her feet.  INSOMNIA Family says not sleep well. Patient says she just gets up at night and does whatever she takes a notion to do.  Pt refuses heath maintence    Review of Systems  Respiratory: Negative for shortness of breath.   Cardiovascular: Negative for chest pain and palpitations.  Musculoskeletal: Negative for myalgias.  All other systems reviewed and are negative.   Allergies  Allergen Reactions  . Penicillins     Outpatient Encounter Prescriptions as of 01/12/2013  Medication Sig Dispense Refill  . amLODipine (NORVASC) 5 MG tablet Take 1 tablet (5 mg total) by mouth daily.  30  tablet  11  . atorvastatin (LIPITOR) 20 MG tablet Take 20 mg by mouth daily.       . benazepril (LOTENSIN) 40 MG tablet Take 40 mg by mouth daily.      . calcium citrate-vitamin D (CITRACAL+D) 315-200 MG-UNIT per tablet Take 1 tablet by mouth 2 (two) times daily.       . cholecalciferol (VITAMIN D) 1000 UNITS tablet Take 1,000 Units by mouth daily.      . furosemide (LASIX) 20 MG tablet Take 20 mg by mouth daily as needed (for fluid.).      Marland Kitchen gabapentin (NEURONTIN) 300 MG capsule Take 300 mg by mouth at bedtime.       Marland Kitchen levothyroxine (SYNTHROID, LEVOTHROID) 25 MCG tablet Take 25 mcg by mouth daily.         No facility-administered encounter medications on file as of 01/12/2013.    Past Medical History  Diagnosis Date  . HTN (hypertension)   . Dementia   . Hypothyroid   . Neuropathy   . UTI (urinary tract infection)   . Chronic pain   . Osteopenia   . Insomnia     Past Surgical History  Procedure Laterality Date  . Appendectomy    . Knee surgery    . Total hip arthroplasty      History   Social History  . Marital Status: Widowed    Spouse Name: N/A    Number of Children: N/A  . Years of Education: N/A   Occupational History  . Not on file.   Social History Main Topics  . Smoking status: Never Smoker   . Smokeless tobacco: Never Used  . Alcohol Use: No  . Drug Use:  No  . Sexually Active: No   Other Topics Concern  . Not on file   Social History Narrative  . No narrative on file          Objective:   Physical Exam  Constitutional: She is oriented to person, place, and time. She appears well-developed and well-nourished.  HENT:  Head: Normocephalic and atraumatic.  Right Ear: External ear normal.  Left Ear: External ear normal.  Nose: Nose normal.  Mouth/Throat: Oropharynx is clear and moist.  Eyes: EOM are normal. Pupils are equal, round, and reactive to light.  Neck: Normal range of motion. Neck supple.  No carotid bruits bil No JVD   Cardiovascular: Normal rate, regular rhythm, normal heart sounds and intact distal pulses.  Exam reveals no gallop and no friction rub.   No murmur heard. Pulmonary/Chest: Effort normal and breath sounds normal. She has no wheezes. She has no rales. She exhibits no tenderness.  Abdominal: Soft. Bowel sounds are normal.  Musculoskeletal: Normal range of motion. She exhibits no edema.  Neurological: She is alert and oriented to person, place, and time. She has normal reflexes.  Skin: Skin is warm and dry.  Psychiatric: She has a normal mood and affect. Her behavior is normal. Judgment and thought content normal.   BP 142/78  Pulse 67  Temp(Src) 97.6 F (36.4 C) (Oral)        Assessment & Plan:  1. Atrial fibrillation  2. Essential hypertension, benig - COMPLETE METABOLIC PANEL WITH GFR - benazepril (LOTENSIN) 40 MG tablet; Take 1 tablet (40 mg total) by mouth daily.  Dispense: 30 tablet; Refill: 5 - amLODipine (NORVASC) 5 MG tablet; Take 1 tablet (5 mg total) by mouth daily.  Dispense: 30 tablet; Refill: 11  3. Other and unspecified hyperlipidemia - NMR Lipoprofile with Lipids - atorvastatin (LIPITOR) 20 MG tablet; Take 1 tablet (20 mg total) by mouth daily.  Dispense: 30 tablet; Refill: 5  4. Hypokalemia   5. Unspecified hypothyroidism - Thyroid Panel With TSH - levothyroxine (SYNTHROID, LEVOTHROID) 25 MCG tablet; Take 1 tablet (25 mcg total) by mouth daily.  Dispense: 30 tablet; Refill: 5  6. Insomnia - traZODone (DESYREL) 50 MG tablet; Take 1 tablet (50 mg total) by mouth at bedtime as needed for sleep.  Dispense: 30 tablet; Refill: 3  7. Unspecified hereditary and idiopathic peripheral neuropathy - gabapentin (NEURONTIN) 300 MG capsule; Take 1 capsule (300 mg total) by mouth at bedtime.  Dispense: 30 capsule; Refill: 5  Peripheral edema  - furosemide (LASIX) 20 MG tablet; Take 1 tablet (20 mg total) by mouth daily as needed (for fluid.).  Dispense: 30 tablet; Refill:  5  Continue all meds  Labs pending Diet discussed Bedtime ritual  [name], FNP

## 2013-01-13 LAB — THYROID PANEL WITH TSH
T3 Uptake: 29.7 % (ref 22.5–37.0)
T4, Total: 9.4 ug/dL (ref 5.0–12.5)

## 2013-01-14 LAB — NMR LIPOPROFILE WITH LIPIDS
HDL Size: 9.4 nm (ref >=9.2)
HDL-C: 42 mg/dL (ref >=40)
LDL Particle Number: 1100 nmol/L — ABNORMAL HIGH (ref <1000)
LP-IR Score: 59 — ABNORMAL HIGH (ref <=45)
Triglycerides: 191 mg/dL — ABNORMAL HIGH (ref <150)
VLDL Size: 57.6 nm — ABNORMAL HIGH (ref >=46.6)

## 2013-01-19 ENCOUNTER — Telehealth: Payer: Self-pay | Admitting: *Deleted

## 2013-01-19 NOTE — Telephone Encounter (Signed)
Patients husband notified of lab results

## 2013-01-29 ENCOUNTER — Telehealth: Payer: Self-pay | Admitting: Nurse Practitioner

## 2013-01-29 DIAGNOSIS — G609 Hereditary and idiopathic neuropathy, unspecified: Secondary | ICD-10-CM

## 2013-01-29 MED ORDER — GABAPENTIN 300 MG PO CAPS: 300.0000 mg | ORAL_CAPSULE | Freq: Three times a day (TID) | ORAL | Status: DC

## 2013-01-29 NOTE — Telephone Encounter (Signed)
PLEASE ADVISE.

## 2013-01-29 NOTE — Telephone Encounter (Signed)
Let patient know that Rx corrected

## 2013-04-15 ENCOUNTER — Ambulatory Visit: Payer: Medicare Other | Admitting: Nurse Practitioner

## 2013-04-15 ENCOUNTER — Other Ambulatory Visit: Payer: Medicare Other

## 2013-06-01 ENCOUNTER — Other Ambulatory Visit: Payer: Self-pay | Admitting: *Deleted

## 2013-06-01 DIAGNOSIS — G4700 Insomnia, unspecified: Secondary | ICD-10-CM

## 2013-06-01 MED ORDER — TRAZODONE HCL 50 MG PO TABS: 50.0000 mg | ORAL_TABLET | Freq: Every evening | ORAL | Status: DC | PRN

## 2013-06-01 NOTE — Telephone Encounter (Signed)
LAST OV 01/12/13.

## 2013-06-09 ENCOUNTER — Other Ambulatory Visit: Payer: Self-pay

## 2013-06-09 MED ORDER — DICLOFENAC SODIUM 1 % TD GEL: 2.0000 g | Freq: Four times a day (QID) | TRANSDERMAL | Status: DC

## 2013-06-09 NOTE — Telephone Encounter (Signed)
Last seen 01/12/13  MMM

## 2013-08-27 ENCOUNTER — Encounter (HOSPITAL_COMMUNITY): Payer: Self-pay | Admitting: Emergency Medicine

## 2013-08-27 ENCOUNTER — Observation Stay (HOSPITAL_COMMUNITY)
Admission: EM | Admit: 2013-08-27 | Discharge: 2013-08-28 | Disposition: A | Discharge status: Home / Self Care | Payer: Medicare Other | Attending: Internal Medicine | Admitting: Internal Medicine

## 2013-08-27 ENCOUNTER — Emergency Department (HOSPITAL_COMMUNITY): Payer: Medicare Other

## 2013-08-27 DIAGNOSIS — E876 Hypokalemia: Secondary | ICD-10-CM | POA: Insufficient documentation

## 2013-08-27 DIAGNOSIS — E039 Hypothyroidism, unspecified: Secondary | ICD-10-CM | POA: Insufficient documentation

## 2013-08-27 DIAGNOSIS — R001 Bradycardia, unspecified: Secondary | ICD-10-CM

## 2013-08-27 DIAGNOSIS — R55 Syncope and collapse: Secondary | ICD-10-CM | POA: Diagnosis present

## 2013-08-27 DIAGNOSIS — R609 Edema, unspecified: Secondary | ICD-10-CM

## 2013-08-27 DIAGNOSIS — R9431 Abnormal electrocardiogram [ECG] [EKG]: Secondary | ICD-10-CM | POA: Insufficient documentation

## 2013-08-27 DIAGNOSIS — I493 Ventricular premature depolarization: Secondary | ICD-10-CM

## 2013-08-27 DIAGNOSIS — I4891 Unspecified atrial fibrillation: Secondary | ICD-10-CM | POA: Insufficient documentation

## 2013-08-27 DIAGNOSIS — Z79899 Other long term (current) drug therapy: Secondary | ICD-10-CM | POA: Insufficient documentation

## 2013-08-27 DIAGNOSIS — M1711 Unilateral primary osteoarthritis, right knee: Secondary | ICD-10-CM

## 2013-08-27 DIAGNOSIS — F419 Anxiety disorder, unspecified: Secondary | ICD-10-CM | POA: Diagnosis present

## 2013-08-27 DIAGNOSIS — R079 Chest pain, unspecified: Secondary | ICD-10-CM | POA: Insufficient documentation

## 2013-08-27 DIAGNOSIS — E785 Hyperlipidemia, unspecified: Secondary | ICD-10-CM

## 2013-08-27 DIAGNOSIS — N390 Urinary tract infection, site not specified: Secondary | ICD-10-CM | POA: Insufficient documentation

## 2013-08-27 DIAGNOSIS — R002 Palpitations: Principal | ICD-10-CM | POA: Insufficient documentation

## 2013-08-27 DIAGNOSIS — F411 Generalized anxiety disorder: Secondary | ICD-10-CM | POA: Insufficient documentation

## 2013-08-27 DIAGNOSIS — F0390 Unspecified dementia without behavioral disturbance: Secondary | ICD-10-CM | POA: Insufficient documentation

## 2013-08-27 DIAGNOSIS — I491 Atrial premature depolarization: Secondary | ICD-10-CM | POA: Diagnosis present

## 2013-08-27 DIAGNOSIS — I10 Essential (primary) hypertension: Secondary | ICD-10-CM

## 2013-08-27 DIAGNOSIS — Z96649 Presence of unspecified artificial hip joint: Secondary | ICD-10-CM | POA: Insufficient documentation

## 2013-08-27 DIAGNOSIS — G629 Polyneuropathy, unspecified: Secondary | ICD-10-CM

## 2013-08-27 DIAGNOSIS — R0602 Shortness of breath: Secondary | ICD-10-CM | POA: Insufficient documentation

## 2013-08-27 LAB — CBC
HCT: 40.7 % (ref 36.0–46.0)
Hemoglobin: 13.8 g/dL (ref 12.0–15.0)
MCV: 87.3 fL (ref 78.0–100.0)
RBC: 4.66 MIL/uL (ref 3.87–5.11)
RDW: 13.1 % (ref 11.5–15.5)
WBC: 12.7 10*3/uL — ABNORMAL HIGH (ref 4.0–10.5)

## 2013-08-27 LAB — GLUCOSE, CAPILLARY: Glucose-Capillary: 115 mg/dL — ABNORMAL HIGH (ref 70–99)

## 2013-08-27 LAB — URINALYSIS, ROUTINE W REFLEX MICROSCOPIC
Bilirubin Urine: NEGATIVE
Ketones, ur: NEGATIVE mg/dL
Nitrite: NEGATIVE
Protein, ur: NEGATIVE mg/dL
Urobilinogen, UA: 1 mg/dL (ref 0.0–1.0)

## 2013-08-27 LAB — D-DIMER, QUANTITATIVE: D-Dimer, Quant: 0.89 ug/mL-FEU — ABNORMAL HIGH (ref 0.00–0.48)

## 2013-08-27 LAB — BASIC METABOLIC PANEL
BUN: 21 mg/dL (ref 6–23)
Chloride: 101 mEq/L (ref 96–112)
GFR calc Af Amer: 53 mL/min — ABNORMAL LOW (ref >90)
Glucose, Bld: 115 mg/dL — ABNORMAL HIGH (ref 70–99)
Potassium: 2.7 mEq/L — CL (ref 3.5–5.1)
Sodium: 141 mEq/L (ref 135–145)

## 2013-08-27 LAB — POCT I-STAT TROPONIN I: Troponin i, poc: 0.01 ng/mL (ref 0.00–0.08)

## 2013-08-27 LAB — URINE MICROSCOPIC-ADD ON

## 2013-08-27 MED ORDER — POTASSIUM CHLORIDE CRYS ER 20 MEQ PO TBCR
40.0000 meq | EXTENDED_RELEASE_TABLET | Freq: Once | ORAL | Status: AC
  Administered 2013-08-27: 40 meq via ORAL
  Filled 2013-08-27: qty 2

## 2013-08-27 MED ORDER — POTASSIUM CHLORIDE 10 MEQ/100ML IV SOLN
10.0000 meq | Freq: Once | INTRAVENOUS | Status: AC
  Administered 2013-08-28: 10 meq via INTRAVENOUS
  Filled 2013-08-27: qty 100

## 2013-08-27 MED ORDER — IOHEXOL 350 MG/ML SOLN
100.0000 mL | Freq: Once | INTRAVENOUS | Status: AC | PRN
  Administered 2013-08-27: 80 mL via INTRAVENOUS

## 2013-08-27 MED ORDER — LORAZEPAM 2 MG/ML IJ SOLN
0.5000 mg | Freq: Once | INTRAMUSCULAR | Status: AC
  Administered 2013-08-27: 0.5 mg via INTRAVENOUS
  Filled 2013-08-27: qty 1

## 2013-08-27 NOTE — ED Notes (Signed)
Bed: RESB Expected date:  Expected time:  Means of arrival:  Comments: EMS , syncope, hx of dementia

## 2013-08-27 NOTE — ED Notes (Signed)
Portable chest x-ray at bedside at this time. 

## 2013-08-27 NOTE — ED Notes (Signed)
[place] 2L placed at this time d/t oxygen saturation dropping to 88% after Ativan

## 2013-08-27 NOTE — ED Notes (Signed)
Spoke with Lab stated D-dimer can be ran off of blood in lab.

## 2013-08-27 NOTE — ED Notes (Signed)
PT's  pulse rate was at 267.  Informed RN.

## 2013-08-27 NOTE — ED Notes (Signed)
Per EMS, pt's family called out for pt "almost blacking out and feeling like she is going to die."  Pt hx anxiety, does not take medication for this.  Pt has not had her medications for tonight, hx dementia, PVCs on monitor, hx of same, being treated by Dr. Antoine Poche with .  Pt A&O at this time, but does not know why she needs to be transported to the hospital.

## 2013-08-27 NOTE — ED Provider Notes (Signed)
CSN: [id number]     Arrival date & time 08/27/13  1945 History   First MD Initiated Contact with Patient 08/27/13 2039     Chief Complaint  Patient presents with  . Near Syncope  . Anxiety   (Consider location/radiation/quality/duration/timing/severity/associated sxs/prior Treatment) HPI Comments: Casey Vasquez is a 79 y.o. female who is brought in today for evaluation of fall after feeling weak. Subsequent to that she has felt anxious, had a fluttering in her chest, and is feeling like she is going to die. These sensations are new for this patient. She is with her daughter currently, in the emergency department, and that daughter lives in [place], [place]. According to the daughter, the patient has been well, recently. There has been no change in her appetite her ability to ambulate, her sleep patterns, or any other stated complaints. The patient cannot contribute to history. The patient has in-home health care, with AIDS, and lives with another relative. There are no other known modifying factors   Level V caveat- dementia   Patient is a 79 y.o. female presenting with anxiety. The history is provided by the patient and a relative.  Anxiety    Past Medical History  Diagnosis Date  . HTN (hypertension)   . Dementia   . Hypothyroid   . Neuropathy   . UTI (urinary tract infection)   . Chronic pain   . Osteopenia   . Insomnia    Past Surgical History  Procedure Laterality Date  . Appendectomy    . Knee surgery    . Total hip arthroplasty     Family History  Problem Relation Age of Onset  . Hypertension    . Alzheimer's disease Mother    History  Substance Use Topics  . Smoking status: Never Smoker   . Smokeless tobacco: Never Used  . Alcohol Use: No   OB History   Grav Para Term Preterm Abortions TAB SAB Ect Mult Living                 Review of Systems  Unable to perform ROS   Allergies  Penicillins  Home Medications   Current Outpatient Rx  Name  Route   Sig  Dispense  Refill  . amLODipine (NORVASC) 5 MG tablet   Oral   Take 1 tablet (5 mg total) by mouth daily.   30 tablet   11   . atorvastatin (LIPITOR) 20 MG tablet   Oral   Take 1 tablet (20 mg total) by mouth daily.   30 tablet   5   . benazepril (LOTENSIN) 40 MG tablet   Oral   Take 1 tablet (40 mg total) by mouth daily.   30 tablet   5   . calcium citrate-vitamin D (CITRACAL+D) 315-200 MG-UNIT per tablet   Oral   Take 1 tablet by mouth 2 (two) times daily.          . cholecalciferol (VITAMIN D) 1000 UNITS tablet   Oral   Take 1,000 Units by mouth daily.         . diclofenac sodium (VOLTAREN) 1 % GEL   Topical   Apply 2 g topically 4 (four) times daily.   100 g   1   . furosemide (LASIX) 20 MG tablet   Oral   Take 1 tablet (20 mg total) by mouth daily as needed (for fluid.).   30 tablet   5   . gabapentin (NEURONTIN) 300 MG capsule   Oral  Take 1 capsule (300 mg total) by mouth 3 (three) times daily.   90 capsule   5   . levothyroxine (SYNTHROID, LEVOTHROID) 25 MCG tablet   Oral   Take 1 tablet (25 mcg total) by mouth daily.   30 tablet   5   . traZODone (DESYREL) 50 MG tablet   Oral   Take 1 tablet (50 mg total) by mouth at bedtime as needed for sleep.   30 tablet   3    BP 144/92  Pulse 67  Temp(Src) 97.6 F (36.4 C) (Oral)  Resp 24  SpO2 100% Physical Exam  Nursing note and vitals reviewed. Constitutional: She is oriented to person, place, and time. She appears well-developed.   Elderly, frail  HENT:  Head: Normocephalic and atraumatic.  Eyes: Conjunctivae and EOM are normal. Pupils are equal, round, and reactive to light.  Neck: Normal range of motion and phonation normal. Neck supple.  Cardiovascular: Normal rate, regular rhythm and intact distal pulses.   Pulmonary/Chest: Effort normal and breath sounds normal. She exhibits no tenderness.  Abdominal: Soft. She exhibits no distension. There is no tenderness. There is no  guarding.  Musculoskeletal: Normal range of motion. She exhibits edema (Bilateral lower extremities. ). She exhibits no tenderness.  Neurological: She is alert and oriented to person, place, and time. She exhibits normal muscle tone.  Skin: Skin is warm and dry.  Psychiatric:  Anxious    ED Course  Procedures (including critical care time)   Medications  LORazepam (ATIVAN) injection 0.5 mg (0.5 mg Intravenous Given 08/27/13 2153)  potassium chloride SA (K-DUR,KLOR-CON) CR tablet 40 mEq (40 mEq Oral Given 08/27/13 2214)    23:20- the patient's daughter reports that she is much, after receiving Ativan.  Patient Vitals for the past 24 hrs:  BP Temp Temp src Pulse Resp SpO2  08/27/13 2201 - - - - - 100 %  08/27/13 2200 144/92 mmHg - - 67 24 -  08/27/13 2156 - - - 72 24 90 %  08/27/13 2121 - - - 267 - -  08/27/13 2115 144/92 mmHg - - 67 - 100 %  08/27/13 2030 165/81 mmHg - - 69 17 100 %  08/27/13 2014 - - - - - 100 %  08/27/13 2000 147/72 mmHg - - 71 14 100 %  08/27/13 1954 174/81 mmHg 97.6 F (36.4 C) Oral 75 18 100 %  08/27/13 1953 - - - 73 - 100 %   D-dimer ordered; will return to just above the age-adjusted, normal level. CT chest angiogram ordered.   Labs Review Labs Reviewed  URINALYSIS, ROUTINE W REFLEX MICROSCOPIC - Abnormal; Notable for the following:    APPearance CLOUDY (*)    Leukocytes, UA LARGE (*)    All other components within normal limits  GLUCOSE, CAPILLARY - Abnormal; Notable for the following:    Glucose-Capillary 115 (*)    All other components within normal limits  CBC - Abnormal; Notable for the following:    WBC 12.7 (*)    All other components within normal limits  BASIC METABOLIC PANEL - Abnormal; Notable for the following:    Potassium 2.7 (*)    Glucose, Bld 115 (*)    GFR calc non Af Amer 45 (*)    GFR calc Af Amer 53 (*)    All other components within normal limits  D-DIMER, QUANTITATIVE - Abnormal; Notable for the following:    D-Dimer,  Quant 0.89 (*)    All other  components within normal limits  URINE MICROSCOPIC-ADD ON  POCT I-STAT TROPONIN I   Imaging Review Dg Chest Portable 1 View  08/27/2013   CLINICAL DATA:  Shortness of breath  EXAM: PORTABLE CHEST - 1 VIEW  COMPARISON:  01/07/2013  FINDINGS: Cardiac shadow is stable. The lungs are well aerated bilaterally. Calcified granuloma is noted in the right lung base stable from the prior exam. No acute infiltrate is noted. No acute bony abnormality is noted.  IMPRESSION: No acute abnormality seen.   Electronically Signed   By: Alcide Clever M.D.   On: 08/27/2013 21:57    EKG Interpretation     Ventricular Rate:  73 PR Interval:  177 QRS Duration: 98 QT Interval:  455 QTC Calculation: 501 R Axis:   69 Text Interpretation:  Sinus rhythm with PACs , irregular rate Prolonged QT interval Since last tracing of earlier today QT is longer            MDM   1. Near syncope   2. Anxiety   3. Palpitations   4. Hypokalemia      Nonspecific symptoms with a moderate component of anxiety. This is likely related to her dementia. She has hypokalemia and a mildly prolonged QT. Cardiac monitor has not shown evidence for ventricular arrhythmia including torsades.  Nursing Notes Reviewed/ Care Coordinated, and agree without changes. Applicable Imaging Reviewed.  Interpretation of Laboratory Data incorporated into ED treatment   Plan: Reevaluate after CT angiogram returns and likely admit for observation.  Care to oncoming provider team.   Flint Melter, MD 08/29/13 [phone]

## 2013-08-27 NOTE — ED Notes (Signed)
Monitor reading non-sustained SVT at 2112.  Strip given to Dr. Effie Shy. Repeat EKG to be completed at this time.  Pt appears to be in sinus rhythm at this time, pt reports feeling "Like my chest is running away from me."

## 2013-08-28 ENCOUNTER — Emergency Department (HOSPITAL_COMMUNITY): Payer: Medicare Other

## 2013-08-28 ENCOUNTER — Encounter (HOSPITAL_COMMUNITY): Payer: Self-pay | Admitting: *Deleted

## 2013-08-28 DIAGNOSIS — N390 Urinary tract infection, site not specified: Secondary | ICD-10-CM

## 2013-08-28 DIAGNOSIS — R002 Palpitations: Secondary | ICD-10-CM | POA: Diagnosis not present

## 2013-08-28 DIAGNOSIS — R55 Syncope and collapse: Secondary | ICD-10-CM | POA: Diagnosis present

## 2013-08-28 DIAGNOSIS — E876 Hypokalemia: Secondary | ICD-10-CM

## 2013-08-28 DIAGNOSIS — F419 Anxiety disorder, unspecified: Secondary | ICD-10-CM | POA: Diagnosis present

## 2013-08-28 DIAGNOSIS — R9431 Abnormal electrocardiogram [ECG] [EKG]: Secondary | ICD-10-CM | POA: Diagnosis present

## 2013-08-28 DIAGNOSIS — I4891 Unspecified atrial fibrillation: Secondary | ICD-10-CM

## 2013-08-28 DIAGNOSIS — I10 Essential (primary) hypertension: Secondary | ICD-10-CM

## 2013-08-28 DIAGNOSIS — F411 Generalized anxiety disorder: Secondary | ICD-10-CM

## 2013-08-28 DIAGNOSIS — I517 Cardiomegaly: Secondary | ICD-10-CM

## 2013-08-28 DIAGNOSIS — F0390 Unspecified dementia without behavioral disturbance: Secondary | ICD-10-CM | POA: Diagnosis present

## 2013-08-28 DIAGNOSIS — I491 Atrial premature depolarization: Secondary | ICD-10-CM | POA: Diagnosis present

## 2013-08-28 LAB — BASIC METABOLIC PANEL
BUN: 17 mg/dL (ref 6–23)
Calcium: 9.4 mg/dL (ref 8.4–10.5)
Chloride: 103 mEq/L (ref 96–112)
Creatinine, Ser: 1.01 mg/dL (ref 0.50–1.10)
GFR calc Af Amer: 56 mL/min — ABNORMAL LOW (ref >90)

## 2013-08-28 LAB — TSH: TSH: 4.167 u[IU]/mL (ref 0.350–4.500)

## 2013-08-28 LAB — TROPONIN I
Troponin I: <0.3 ng/mL (ref <0.30)
Troponin I: <0.3 ng/mL (ref <0.30)

## 2013-08-28 LAB — CBC
HCT: 36.9 % (ref 36.0–46.0)
MCHC: 33.1 g/dL (ref 30.0–36.0)
Platelets: 243 10*3/uL (ref 150–400)
RDW: 13.1 % (ref 11.5–15.5)
WBC: 9.2 10*3/uL (ref 4.0–10.5)

## 2013-08-28 LAB — MAGNESIUM: Magnesium: 2.1 mg/dL (ref 1.5–2.5)

## 2013-08-28 MED ORDER — LEVOFLOXACIN 500 MG PO TABS: 500.0000 mg | ORAL_TABLET | Freq: Every day | ORAL | Status: DC

## 2013-08-28 MED ORDER — SODIUM CHLORIDE 0.9 % IJ SOLN: 3.0000 mL | Freq: Two times a day (BID) | INTRAMUSCULAR | Status: DC

## 2013-08-28 MED ORDER — GABAPENTIN 300 MG PO CAPS
300.0000 mg | ORAL_CAPSULE | Freq: Three times a day (TID) | ORAL | Status: DC
  Administered 2013-08-28: 300 mg via ORAL
  Filled 2013-08-28 (×3): qty 1

## 2013-08-28 MED ORDER — DEXTROSE 5 % IV SOLN
1.0000 g | INTRAVENOUS | Status: DC
  Administered 2013-08-28: 04:00:00 1 g via INTRAVENOUS
  Filled 2013-08-28: qty 10

## 2013-08-28 MED ORDER — VITAMIN D3 25 MCG (1000 UNIT) PO TABS
1000.0000 [IU] | ORAL_TABLET | Freq: Every day | ORAL | Status: DC
  Administered 2013-08-28: 10:00:00 1000 [IU] via ORAL
  Filled 2013-08-28: qty 1

## 2013-08-28 MED ORDER — INFLUENZA VAC SPLIT QUAD 0.5 ML IM SUSP: 0.5000 mL | INTRAMUSCULAR | Status: DC

## 2013-08-28 MED ORDER — ONDANSETRON HCL 4 MG/2ML IJ SOLN: 4.0000 mg | Freq: Four times a day (QID) | INTRAMUSCULAR | Status: DC | PRN

## 2013-08-28 MED ORDER — ASPIRIN EC 81 MG PO TBEC
81.0000 mg | DELAYED_RELEASE_TABLET | Freq: Every day | ORAL | Status: DC
  Administered 2013-08-28: 81 mg via ORAL
  Filled 2013-08-28: qty 1

## 2013-08-28 MED ORDER — SODIUM CHLORIDE 0.9 % IJ SOLN: 3.0000 mL | INTRAMUSCULAR | Status: DC | PRN

## 2013-08-28 MED ORDER — AMLODIPINE BESYLATE 5 MG PO TABS
5.0000 mg | ORAL_TABLET | Freq: Every day | ORAL | Status: DC
  Administered 2013-08-28: 10:00:00 5 mg via ORAL
  Filled 2013-08-28: qty 1

## 2013-08-28 MED ORDER — ONDANSETRON HCL 4 MG PO TABS: 4.0000 mg | ORAL_TABLET | Freq: Four times a day (QID) | ORAL | Status: DC | PRN

## 2013-08-28 MED ORDER — POTASSIUM CHLORIDE CRYS ER 20 MEQ PO TBCR: 40.0000 meq | EXTENDED_RELEASE_TABLET | Freq: Every day | ORAL | Status: DC

## 2013-08-28 MED ORDER — BENAZEPRIL HCL 40 MG PO TABS
40.0000 mg | ORAL_TABLET | Freq: Every day | ORAL | Status: DC
  Administered 2013-08-28: 10:00:00 40 mg via ORAL
  Filled 2013-08-28: qty 1

## 2013-08-28 MED ORDER — LEVOTHYROXINE SODIUM 25 MCG PO TABS
25.0000 ug | ORAL_TABLET | Freq: Every day | ORAL | Status: DC
  Administered 2013-08-28: 10:00:00 25 ug via ORAL
  Filled 2013-08-28 (×2): qty 1

## 2013-08-28 MED ORDER — TRAZODONE HCL 50 MG PO TABS
50.0000 mg | ORAL_TABLET | Freq: Every evening | ORAL | Status: DC | PRN
  Filled 2013-08-28: qty 1

## 2013-08-28 MED ORDER — POTASSIUM CHLORIDE 20 MEQ PO PACK: 40.0000 meq | PACK | Freq: Two times a day (BID) | ORAL | Status: DC

## 2013-08-28 MED ORDER — SODIUM CHLORIDE 0.9 % IV SOLN: 250.0000 mL | INTRAVENOUS | Status: DC | PRN

## 2013-08-28 MED ORDER — FUROSEMIDE 20 MG PO TABS
20.0000 mg | ORAL_TABLET | Freq: Every day | ORAL | Status: DC | PRN
  Filled 2013-08-28: qty 1

## 2013-08-28 MED ORDER — ATORVASTATIN CALCIUM 20 MG PO TABS
20.0000 mg | ORAL_TABLET | Freq: Every day | ORAL | Status: DC
  Filled 2013-08-28: qty 1

## 2013-08-28 MED ORDER — POTASSIUM CHLORIDE CRYS ER 20 MEQ PO TBCR
40.0000 meq | EXTENDED_RELEASE_TABLET | Freq: Once | ORAL | Status: AC
  Administered 2013-08-28: 40 meq via ORAL
  Filled 2013-08-28: qty 2

## 2013-08-28 MED ORDER — POTASSIUM CHLORIDE CRYS ER 20 MEQ PO TBCR
40.0000 meq | EXTENDED_RELEASE_TABLET | Freq: Two times a day (BID) | ORAL | Status: DC
  Administered 2013-08-28 (×2): 40 meq via ORAL
  Filled 2013-08-28 (×3): qty 2

## 2013-08-28 NOTE — Progress Notes (Signed)
Utilization review completed.  

## 2013-08-28 NOTE — Progress Notes (Signed)
Radiology follow up of (L) hand IV infiltration last pm during CT scan. Tech reported approx 10-74mL.  Pt discharged today but RN reported no issues with site.  Brayton El PA-C Interventional Radiology 08/28/2013 1:45 PM

## 2013-08-28 NOTE — Evaluation (Signed)
Physical Therapy Evaluation Patient Details Name: Casey Vasquez MRN: [id number] DOB: 1923-12-23 Today's Date: 08/28/2013 Time: 4098-1191 PT Time Calculation (min): 12 min  PT Assessment / Plan / Recommendation History of Present Illness  Pt is an 79 yo female with mild to moderate dementia, afib, hypothyroidism lives at home with 24 hour caregivers comes in after being found today (was left alone for just minutes) on ground breathing heavily, very anxious and complaining that her heart was racing.  She was found conscious.  So unknown if she really syncopized or not.  She has been having no fevers.  No n/v/d.  Her daughter is here who is visiting from [place].  She has never had a syncopal event before.  She eats and drinks well.  She is in ED now and has no complaints.  No sob.  No cp.  No le edema or swelling.  She denies any dysuria or hematuria.  Denies any cough.  No problems moving her extemities, talking, or swallowing.  Clinical Impression  Pt admitted for above. Pt currently presenting with functional limitations due to deficits listed below (see PT Problem List). Pt would benefit from skilled PT to increase independence and safety during mobility and to allow d/c home. Pt able to ambulate 53' with RW and min assist to guide RW. Pt recommends HHPT to increase LE strength, endurance, and safety during mobility.  Pt's family report pt has 24/7 caregivers.    PT Assessment  Patient needs continued PT services    Follow Up Recommendations  Home health PT    Does the patient have the potential to tolerate intense rehabilitation      Barriers to Discharge        Equipment Recommendations  None recommended by PT    Recommendations for Other Services     Frequency Min 3X/week    Precautions / Restrictions Precautions Precautions: Fall Restrictions Weight Bearing Restrictions: No   Pertinent Vitals/Pain Pt c/o L toe pain during ambulation but unable to rate and wished to  continue ambulation. Pt positioned to comfort at end of session.      Mobility  Bed Mobility Bed Mobility: Supine to Sit;Sitting - Scoot to [place] of Bed;Sit to Supine Supine to Sit: 4: Min guard Sitting - Scoot to [place] of Bed: 5: Supervision Sit to Supine: 3: Mod assist Details for Bed Mobility Assistance: min guard to supervision during supine to sit and scooting EOB to ensure safety and mod assist during sit to supine to assist bil LEs into bed. Transfers Transfers: Sit to Stand;Stand to Sit Sit to Stand: 4: Min assist;With upper extremity assist;From bed Stand to Sit: 4: Min assist;With upper extremity assist;To bed Details for Transfer Assistance: assist to rise and steady and to control descent during stand to sit. VC's for hand placement. Ambulation/Gait Ambulation/Gait Assistance: 4: Min assist Ambulation Distance (Feet): 80 Feet Assistive device: Rolling walker Ambulation/Gait Assistance Details: assist to guide RW and steady pt during turns. VC's to avoid wall and objects and for gait sequence. pt veering off to the R side during ambulation could be due to pt uses standard walker at home and not RW.  pt reports L toe pain during ambulation but did not wish to cease ambulation and unable to rate. Gait Pattern: Step-through pattern;Decreased stride length;Decreased dorsiflexion - right;Decreased dorsiflexion - left Gait velocity: decreased General Gait Details: pt c/o R knee buckling during last 10' of ambulation but PT did not note and LOB episodes and pt able  to continue ambulation into room.    Exercises     PT Diagnosis: Difficulty walking;Generalized weakness  PT Problem List: Decreased strength;Decreased activity tolerance;Decreased balance;Decreased mobility;Decreased knowledge of use of DME PT Treatment Interventions: DME instruction;Gait training;Stair training;Functional mobility training;Therapeutic activities;Therapeutic exercise;Balance training;Neuromuscular  re-education;Patient/family education     PT Goals(Current goals can be found in the care plan section) Acute Rehab PT Goals Patient Stated Goal: none specified PT Goal Formulation: With patient Time For Goal Achievement: 09/11/13 Potential to Achieve Goals: Good  Visit Information  Last PT Received On: 08/28/13 Assistance Needed: +1 History of Present Illness: Pt is an 79 yo female with mild to moderate dementia, afib, hypothyroidism lives at home with 24 hour caregivers comes in after being found today (was left alone for just minutes) on ground breathing heavily, very anxious and complaining that her heart was racing.  She was found conscious.  So unknown if she really syncopized or not.  She has been having no fevers.  No n/v/d.  Her daughter is here who is visiting from [place], [place].  She has never had a syncopal event before.  She eats and drinks well.  She is in ED now and has no complaints.  No sob.  No cp.  No le edema or swelling.  She denies any dysuria or hematuria.  Denies any cough.  No problems moving her extemities, talking, or swallowing.       Prior Functioning  Home Living Family/patient expects to be discharged to:: Private residence Living Arrangements: Other relatives;Other (Comment) (per chart pt lives with nephew and has 24/7 caregivers) Available Help at Discharge: Family;Other (Comment);Available 24 hours/day (caregivers) Type of Home: House Home Access: Stairs to enter [employer] of Steps: 3 Entrance Stairs-Rails: Can reach both Home Layout: Multi-level;Able to live on main level with bedroom/bathroom Home Equipment: Walker - standard;Grab bars - tub/shower;Grab bars - toilet;Shower seat - built in Additional Comments: per pt's family member pt uses standard walker as she does not care for RW Prior Function Level of Independence: Needs assistance Comments: pt requires supervision at all times and assist for ADLs Communication Communication: No  difficulties    Cognition  Cognition Arousal/Alertness: Awake/alert Behavior During Therapy: WFL for tasks assessed/performed Overall Cognitive Status: History of cognitive impairments - at baseline    Extremity/Trunk Assessment Lower Extremity Assessment Lower Extremity Assessment: Generalized weakness;LLE deficits/detail;RLE deficits/detail RLE Deficits / Details: functional weakness as pt reports occassional R knee pain and requires standard walker to ambulate LLE Deficits / Details: functional LE weakness as pt requires standard walker to ambulate   Balance    End of Session PT - End of Session Activity Tolerance: Patient tolerated treatment well Patient left: in bed;with nursing/sitter in room;with family/visitor present;with call bell/phone within reach  GP Functional Assessment Tool Used: clinical judgement Functional Limitation: Mobility: Walking and moving around Mobility: Walking and Moving Around Current Status (Z6109): At least 20 percent but less than 40 percent impaired, limited or restricted Mobility: Walking and Moving Around Goal Status [phone]): At least 1 percent but less than 20 percent impaired, limited or restricted   Sol Blazing 08/28/2013, 10:49 AM

## 2013-08-28 NOTE — Care Management Note (Addendum)
    Page 1 of 1   08/28/2013     11:58:39 AM   CARE MANAGEMENT NOTE 08/28/2013  Patient:  Casey Vasquez, Casey Vasquez   Account Number:  [id number]  Date Initiated:  08/28/2013  Documentation initiated by:  Lanier Clam  Subjective/Objective Assessment:   79 Y/O F ADMITTED W/SYNCOPE.ZO:XWRUEAVW.     Action/Plan:   FROM HOME W/DTR.HAS PCP,PHARMACY.   Anticipated DC Date:  08/29/2013   Anticipated DC Plan:  HOME W HOME HEALTH SERVICES      DC Planning Services  CM consult      Choice offered to / List presented to:  C-1 Patient        HH arranged  HH-2 PT      [facility] agency  [facility]   Status of service:  Completed, signed off Medicare Important Message given?   (If response is "NO", the following Medicare IM given date fields will be blank) Date Medicare IM given:   Date Additional Medicare IM given:    Discharge Disposition:  HOME W HOME HEALTH SERVICES  Per UR Regulation:  Reviewed for med. necessity/level of care/duration of stay  If discussed at Long Length of Stay Meetings, dates discussed:    Comments:  08/28/13 Janda Cargo RN,BSN NCM [phone] [facility] REP KRISTEN AWARE OF D/C,& HHPT.

## 2013-08-28 NOTE — Progress Notes (Signed)
  Echocardiogram 2D Echocardiogram has been performed.  Casey Vasquez 08/28/2013, 8:36 AM

## 2013-08-28 NOTE — Progress Notes (Signed)
Pt discharged home; discharge instructions explained to patient's daughter; prescriptions for potassium and levaquin given to daughter along w/ a copy of discharge instructions; daughter verbalized understanding discharge instructions.

## 2013-08-28 NOTE — Discharge Summary (Signed)
Physician Discharge Summary  Casey Vasquez ZOX:[id number] DOB: 1924/08/12 DOA: 08/27/2013  PCP: Rudi Heap, MD  Admit date: 08/27/2013 Discharge date: 08/28/2013  Recommendations for Outpatient Follow-up:  1. Pt will need to follow up with PCP in 2-3 weeks post discharge 2. Please obtain BMP to evaluate electrolytes and kidney function, potassium level 3. Please note that pt was discharged home on Potassium supplementation and was also given one dose of K-dur 40 MEQ piror to discharge  4. Please also check CBC to evaluate Hg and Hct levels 5. Please note that pt was discharged on Levaquin to complete therapy for 7 more days post discharge  6. Please note that lasix was stopped on admission as pt was clinically dehydrated and volume depleted 7. Will defer decision to restart Lasix to PCP as indicated   Discharge Diagnoses: Syncope secondary to dehydration, UTI, failure to thrive  Principal Problem:   Syncope, near Active Problems:   HTN (hypertension)   Urinary tract infection   Hypothyroidism   PAC (premature atrial contraction)   Hypokalemia   Prolonged Q-T interval on ECG   Dementia   Anxiety  Discharge Condition: Stable  Diet recommendation: Heart healthy diet discussed in details   History of present illness:  79 yo female with mild to moderate dementia, afib, hypothyroidism lives at home with 24 hour caregivers comes in after being found on ground breathing heavily, very anxious and complaining that her heart was racing. She was found conscious.She has been having no fevers. No n/v/d. No sob. No cp. No le edema or swelling. She denies any dysuria or hematuria. Denies any cough. No problems moving her extemities, talking, or swallowing.   Hospital Course:  Principal Problem:   Syncope, near - it is unclear if it actually occurred as no one witnessed the event and pt can not remember - no events on telemetry  - pt feels well this AM and daughter insisting on taking pt  home today  - pt has 24 hour care at home - 3 sets of cardiac enzymes negative  Active Problems:   HTN (hypertension) - reasonable inpatient control    Urinary tract infection - please note that pt was discharged on Levaquin to complete therapy for 7 more days post discharge    Hypothyroidism - continue synthroid at current dose - daughter advised that her mother needs to have TSH checked upon follow up with PCP   Hypokalemia - supplemented and prescription provided as well upon discharge    Prolonged Q-T interval on ECG - no events on tele, CE's x 3    Dementia - appears to be stable and at baseline   Anxiety - stable   Procedures/Studies: Ct Head Wo Contrast   08/28/2013   No acute intracranial abnormality. Stable nonspecific white matter changes.   Ct Angio Chest Pe W/cm &/or Wo Cm  08/27/2013  No evidence of acute pulmonary embolus. Sequelae of previous pulmonary granulomatous disease.  Dg Chest Portable 1 View    08/27/2013   No acute abnormality seen.    Consultations:  None  Antibiotics:  Levaquin to complete for 7 more days post discharge   Discharge Exam: Filed Vitals:   08/28/13 0524  BP: 147/64  Pulse: 55  Temp: 97.7 F (36.5 C)  Resp: 20   Filed Vitals:   08/27/13 2201 08/28/13 0210 08/28/13 0500 08/28/13 0524  BP:  158/86  147/64  Pulse:  68  55  Temp:  97.9 F (36.6 C)  97.7 F (  36.5 C)  TempSrc:  Oral  Oral  Resp:  20  20  Height:  5\' 4"  (1.626 m)    Weight:  72.6 kg (160 lb 0.9 oz) 72.6 kg (160 lb 0.9 oz)   SpO2: 100% 100%  98%    General: Pt is alert, follows commands appropriately, not in acute distress Cardiovascular: Regular rate and rhythm, S1/S2 +, no murmurs, no rubs, no gallops Respiratory: Clear to auscultation bilaterally, no wheezing, no crackles, no rhonchi Abdominal: Soft, non tender, non distended, bowel sounds +, no guarding Extremities: no edema, no cyanosis, pulses palpable bilaterally DP and PT Neuro: Grossly  nonfocal  Discharge Instructions  Discharge Orders   Future Appointments Provider Department Dept Phone   08/31/2013 9:45 AM [name], FNP [facility] [phone]   Future Orders Complete By Expires   Diet - low sodium heart healthy  As directed    Increase activity slowly  As directed        Medication List    STOP taking these medications       furosemide 20 MG tablet  Commonly known as:  LASIX      TAKE these medications       amLODipine 5 MG tablet  Commonly known as:  NORVASC  Take 1 tablet (5 mg total) by mouth daily.     atorvastatin 20 MG tablet  Commonly known as:  LIPITOR  Take 1 tablet (20 mg total) by mouth daily.     benazepril 40 MG tablet  Commonly known as:  LOTENSIN  Take 1 tablet (40 mg total) by mouth daily.     calcium citrate-vitamin D 315-200 MG-UNIT per tablet  Commonly known as:  CITRACAL+D  Take 1 tablet by mouth 2 (two) times daily.     cholecalciferol 1000 UNITS tablet  Commonly known as:  VITAMIN D  Take 1,000 Units by mouth daily.     diclofenac sodium 1 % Gel  Commonly known as:  VOLTAREN  Apply 2 g topically 4 (four) times daily.     gabapentin 300 MG capsule  Commonly known as:  NEURONTIN  Take 1 capsule (300 mg total) by mouth 3 (three) times daily.     levofloxacin 500 MG tablet  Commonly known as:  LEVAQUIN  Take 1 tablet (500 mg total) by mouth daily.     levothyroxine 25 MCG tablet  Commonly known as:  SYNTHROID, LEVOTHROID  Take 1 tablet (25 mcg total) by mouth daily.     potassium chloride SA 20 MEQ tablet  Commonly known as:  K-DUR,KLOR-CON  Take 2 tablets (40 mEq total) by mouth daily.     traZODone 50 MG tablet  Commonly known as:  DESYREL  Take 1 tablet (50 mg total) by mouth at bedtime as needed for sleep.           Follow-up Information   Follow up with Rudi Heap, MD In 2 weeks.   Specialty:  Family Medicine   Contact information:   [address]  [place] [address] [phone]       Follow up with Debbora Presto, MD. (As needed if symptoms worsen)    Specialty:  Internal Medicine   Contact information:   [address] [place] [address] [phone]        The results of significant diagnostics from this hospitalization (including imaging, microbiology, ancillary and laboratory) are listed below for reference.     Microbiology: No results found for this or  any previous visit (from the past 240 hour(s)).   Labs: Basic Metabolic Panel:  Recent Labs Lab 08/27/13 2036 08/28/13 0005 08/28/13 0346  NA 141  --  139  K 2.7*  --  2.9*  CL 101  --  103  CO2 27  --  28  GLUCOSE 115*  --  110*  BUN 21  --  17  CREATININE 1.07  --  1.01  CALCIUM 10.2  --  9.4  MG  --  2.1  --    CBC:  Recent Labs Lab 08/27/13 2036 08/28/13 0346  WBC 12.7* 9.2  HGB 13.8 12.2  HCT 40.7 36.9  MCV 87.3 88.1  PLT 274 243   Cardiac Enzymes:  Recent Labs Lab 08/28/13 0346 08/28/13 0855  TROPONINI <0.30 <0.30   CBG:  Recent Labs Lab 08/27/13 2009  GLUCAP 115*   SIGNED: Time coordinating discharge: Over 30 minutes  Debbora Presto, MD  [facility] 08/28/2013, 11:22 AM Pager [phone]  If 7PM-7AM, please contact night-coverage [email or web address] Password [email or web address]

## 2013-08-28 NOTE — H&P (Signed)
PCP:   Rudi Heap, MD   Chief Complaint:  Passed out possibly  HPI: 79 yo female with mild to moderate dementia, afib, hypothyroidism lives at home with 24 hour caregivers comes in after being found today (was left alone for just minutes) on ground breathing heavily, very anxious and complaining that her heart was racing.  She was found conscious.  So unknown if she really syncopized or not.  She has been having no fevers.  No n/v/d.  Her daughter is here who is visiting from [place], [place].  She has never had a syncopal event before.  She eats and drinks well.  She is in ED now and has no complaints.  No sob.  No cp.  No le edema or swelling.  She denies any dysuria or hematuria.  Denies any cough.  No problems moving her extemities, talking, or swallowing.  Review of Systems:  Positive and negative as per HPI otherwise all other systems are negative but may be unreliable due to pt dementia  Past Medical History: Past Medical History  Diagnosis Date  . HTN (hypertension)   . Dementia   . Hypothyroid   . Neuropathy   . UTI (urinary tract infection)   . Chronic pain   . Osteopenia   . Insomnia    Past Surgical History  Procedure Laterality Date  . Appendectomy    . Knee surgery    . Total hip arthroplasty      Medications: Prior to Admission medications   Medication Sig Start Date End Date Taking? Authorizing Provider  amLODipine (NORVASC) 5 MG tablet Take 1 tablet (5 mg total) by mouth daily. 01/12/13 01/12/14 Yes [name], FNP  atorvastatin (LIPITOR) 20 MG tablet Take 1 tablet (20 mg total) by mouth daily. 01/12/13  Yes [name], FNP  benazepril (LOTENSIN) 40 MG tablet Take 1 tablet (40 mg total) by mouth daily. 01/12/13  Yes [name], FNP  calcium citrate-vitamin D (CITRACAL+D) 315-200 MG-UNIT per tablet Take 1 tablet by mouth 2 (two) times daily.    Yes Historical Provider, MD  cholecalciferol (VITAMIN D) 1000 UNITS tablet Take 1,000 Units  by mouth daily.   Yes Historical Provider, MD  diclofenac sodium (VOLTAREN) 1 % GEL Apply 2 g topically 4 (four) times daily. 06/09/13  Yes [name], FNP  furosemide (LASIX) 20 MG tablet Take 1 tablet (20 mg total) by mouth daily as needed (for fluid.). 01/12/13  Yes [name], FNP  gabapentin (NEURONTIN) 300 MG capsule Take 1 capsule (300 mg total) by mouth 3 (three) times daily. 01/29/13  Yes [name], FNP  levothyroxine (SYNTHROID, LEVOTHROID) 25 MCG tablet Take 1 tablet (25 mcg total) by mouth daily. 01/12/13  Yes [name], FNP  traZODone (DESYREL) 50 MG tablet Take 1 tablet (50 mg total) by mouth at bedtime as needed for sleep. 06/01/13  Yes [name], FNP    Allergies:   Allergies  Allergen Reactions  . Penicillins Other (See Comments)    uknown    Social History:  reports that she has never smoked. She has never used smokeless tobacco. She reports that she does not drink alcohol or use illicit drugs.  Family History: Family History  Problem Relation Age of Onset  . Hypertension    . Alzheimer's disease Mother     Physical Exam: Filed Vitals:   08/27/13 2121 08/27/13 2156 08/27/13 2200 08/27/13 2201  BP:   144/92   Pulse: 267 72 67   Temp:  TempSrc:      Resp:  24 24   SpO2:  90%  100%   General appearance: alert, cooperative and no distress  Pleasantly confused Head: Normocephalic, without obvious abnormality, atraumatic Eyes: negative Nose: Nares normal. Septum midline. Mucosa normal. No drainage or sinus tenderness. Neck: no JVD and supple, symmetrical, trachea midline Lungs: clear to auscultation bilaterally Heart: regular rate and rhythm, S1, S2 normal, no murmur, click, rub or gallop Abdomen: soft, non-tender; bowel sounds normal; no masses,  no organomegaly Extremities: extremities normal, atraumatic, no cyanosis or edema Pulses: 2+ and symmetric Skin: Skin color, texture, turgor normal. No rashes or  lesions Neurologic: Grossly normal  Labs on Admission:   Recent Labs  08/27/13 2036  NA 141  K 2.7*  CL 101  CO2 27  GLUCOSE 115*  BUN 21  CREATININE 1.07  CALCIUM 10.2    Recent Labs  08/27/13 2036  WBC 12.7*  HGB 13.8  HCT 40.7  MCV 87.3  PLT 274   Radiological Exams on Admission: Ct Angio Chest Pe W/cm &/or Wo Cm  08/27/2013   CLINICAL DATA:  89-year- female with chest pain, near syncope. Shortness of breath. Initial encounter.  EXAM: CT ANGIOGRAPHY CHEST WITH CONTRAST  TECHNIQUE: Multidetector CT imaging of the chest was performed using the standard protocol during bolus administration of intravenous contrast. Multiplanar CT image reconstructions including MIPs were obtained to evaluate the vascular anatomy.  CONTRAST:  80mL OMNIPAQUE IOHEXOL 350 MG/ML SOLN  COMPARISON:  Chest radiographs the same day and earlier.  FINDINGS: Good contrast bolus timing in the pulmonary arterial tree.  No focal filling defect identified in the pulmonary arterial tree to suggest the presence of acute pulmonary embolism.  Major airways are patent. Calcified right hilar and subcarinal lymph nodes in keeping with previous granulomatous infection. Minor atelectasis. Mild focal calcification of the inferior major fissure. Small calcified granuloma in the left upper lobe.  No pleural effusion. Cardiomegaly. A small volume of pericardial fluid probably is physiologic (series 4, image 71). No mediastinal lymphadenopathy. Aortic atherosclerosis, including coronary artery involvement.  Negative visualized upper abdominal viscera.  Osteopenia. No acute osseous abnormality identified.  Review of the MIP images confirms the above findings.  IMPRESSION: 1. No evidence of acute pulmonary embolus.  2. Cardiomegaly. Minimal atelectasis. Sequelae of previous pulmonary granulomatous disease.  3. An IV contrast extravasation occurred at the IV site which was the hand/wrist. Estimated volume 10-20 mL extravasated.  Management discussed by telephone with Dr. Cy Blamer On 08/27/2013 at 23:46 .   Electronically Signed   By: Augusto Gamble M.D.   On: 08/27/2013 23:49   Dg Chest Portable 1 View  08/27/2013   CLINICAL DATA:  Shortness of breath  EXAM: PORTABLE CHEST - 1 VIEW  COMPARISON:  01/07/2013  FINDINGS: Cardiac shadow is stable. The lungs are well aerated bilaterally. Calcified granuloma is noted in the right lung base stable from the prior exam. No acute infiltrate is noted. No acute bony abnormality is noted.  IMPRESSION: No acute abnormality seen.   Electronically Signed   By: Alcide Clever M.D.   On: 08/27/2013 21:57    Assessment/Plan 79 yo female with dementia, syncope with prolonged qtc and hypokalemia, with uti  Principal Problem:   Syncope, near possible - place on tele.  Replete k, ck mag level.  Repeat ekg in am, has prolonged qtc possibly from electrolyte abnormality.  Noted in chart that she was in svt, but after reviewing all stripes available, do not  find any that show svt.  Rate is normal with some pacs no acute changes.  Will ck 2 D echo in am and freq neuro cks, but nothing focal on exam now.  Serial cardiac enzymes.  Active Problems:   HTN (hypertension) stable   Urinary tract infection place on rocephin   Hypothyroidism   PAC (premature atrial contraction)   Hypokalemia   Prolonged Q-T interval on ECG   Dementia   Anxiety  Probably d/c tom afternoon back home with family.  Chamika Cunanan A 08/28/2013, 12:12 AM

## 2013-08-28 NOTE — ED Notes (Signed)
Pt to CT at this time.

## 2013-08-28 NOTE — Evaluation (Signed)
I have reviewed this note and agree with all findings. Kati Isley Zinni, PT, DPT Pager: [phone]   

## 2013-08-28 NOTE — Progress Notes (Signed)
G-Codes for PT evaluation   08/28/13 1026  PT Time Calculation  PT Start Time 0858  PT Stop Time 0910  PT Time Calculation (min) 12 min  PT G-Codes **NOT FOR INPATIENT CLASS**  Functional Assessment Tool Used clinical judgement  Functional Limitation Mobility: Walking and moving around  Mobility: Walking and Moving Around Current Status (O1308) CJ  Mobility: Walking and Moving Around Goal Status (M5784) CI  PT General Charges  $$ ACUTE PT VISIT 1 Procedure  PT Evaluation  $Initial PT Evaluation Tier I 1 Procedure  PT Treatments  $Gait Training 8-22 mins   Zenovia Jarred, PT, DPT 08/28/2013 Pager: [phone]

## 2013-08-31 ENCOUNTER — Ambulatory Visit (INDEPENDENT_AMBULATORY_CARE_PROVIDER_SITE_OTHER): Payer: Medicare Other | Admitting: Nurse Practitioner

## 2013-08-31 ENCOUNTER — Encounter: Payer: Self-pay | Admitting: Nurse Practitioner

## 2013-08-31 VITALS — BP 139/68 | HR 58 | Temp 97.2°F | Ht 64.0 in | Wt 160.9 lb

## 2013-08-31 DIAGNOSIS — G4700 Insomnia, unspecified: Secondary | ICD-10-CM

## 2013-08-31 DIAGNOSIS — G609 Hereditary and idiopathic neuropathy, unspecified: Secondary | ICD-10-CM

## 2013-08-31 DIAGNOSIS — E876 Hypokalemia: Secondary | ICD-10-CM

## 2013-08-31 MED ORDER — GABAPENTIN 300 MG PO CAPS: 300.0000 mg | ORAL_CAPSULE | Freq: Three times a day (TID) | ORAL | Status: AC

## 2013-08-31 MED ORDER — DICLOFENAC SODIUM 1 % TD GEL: 2.0000 g | Freq: Four times a day (QID) | TRANSDERMAL | Status: AC

## 2013-08-31 MED ORDER — TRAZODONE HCL 50 MG PO TABS: 50.0000 mg | ORAL_TABLET | Freq: Every evening | ORAL | Status: AC | PRN

## 2013-08-31 NOTE — Patient Instructions (Signed)

## 2013-08-31 NOTE — Progress Notes (Signed)
  Subjective:    Patient ID: Casey Vasquez, female    DOB: May 22, 1924, 79 y.o.   MRN: [id number]  HPI  Patient here today for hospital follow up- she was in hospital for UTI and hypokalemia- was discharged Last Friday- has done well over the weekend- she was out back on a short course of Potassium and they stopped her lasix until K is normal then she can go back on it.    Review of Systems  Constitutional: Negative.   HENT: Negative.   Eyes: Negative.   Respiratory: Negative.   Cardiovascular: Negative.   Gastrointestinal: Negative.   Endocrine: Negative.   Genitourinary: Negative.   Musculoskeletal: Negative.   Neurological: Negative.   Hematological: Negative.   Psychiatric/Behavioral: Negative.        Objective:   Physical Exam  Constitutional: She is oriented to person, place, and time. She appears well-developed and well-nourished.  Cardiovascular: Normal rate and normal heart sounds.   Pulmonary/Chest: Effort normal and breath sounds normal.  Abdominal: Soft. Bowel sounds are normal.  Neurological: She is alert and oriented to person, place, and time.  Skin: Skin is warm.  Psychiatric: She has a normal mood and affect. Her behavior is normal. Judgment and thought content normal.    BP 139/68  Pulse 58  Temp(Src) 97.2 F (36.2 C) (Oral)  Ht 5\' 4"  (1.626 m)  Wt 160 lb 14.4 oz (72.984 kg)  BMI 27.60 kg/m2       Assessment & Plan:   1. Insomnia   2. Unspecified hereditary and idiopathic peripheral neuropathy   3. Hypokalemia    Orders Placed This Encounter  Procedures  . CMP14+EGFR   Meds ordered this encounter  Medications  . diclofenac sodium (VOLTAREN) 1 % GEL    Sig: Apply 2 g topically 4 (four) times daily.    Dispense:  600 g    Refill:  1    Order Specific Question:  Supervising Provider    Answer:  Ernestina Penna [[id number]]  . traZODone (DESYREL) 50 MG tablet    Sig: Take 1 tablet (50 mg total) by mouth at bedtime as needed for sleep.   Dispense:  30 tablet    Refill:  5    Order Specific Question:  Supervising Provider    Answer:  Ernestina Penna [[id number]]  . gabapentin (NEURONTIN) 300 MG capsule    Sig: Take 1 capsule (300 mg total) by mouth 3 (three) times daily.    Dispense:  90 capsule    Refill:  5    Order Specific Question:  Supervising Provider    Answer:  Deborra Medina    Continue all meds Labs pending Diet and exercise encouraged Health maintenance reviewed Follow up in 3 months  [name], FNP

## 2013-09-01 ENCOUNTER — Other Ambulatory Visit: Payer: Self-pay

## 2013-09-01 DIAGNOSIS — E785 Hyperlipidemia, unspecified: Secondary | ICD-10-CM

## 2013-09-01 LAB — CMP14+EGFR
AST: 20 IU/L (ref 0–40)
Albumin/Globulin Ratio: 1.7 (ref 1.1–2.5)
BUN: 22 mg/dL (ref 8–27)
CO2: 26 mmol/L (ref 18–29)
Calcium: 9.9 mg/dL (ref 8.6–10.2)
Chloride: 102 mmol/L (ref 97–108)
Creatinine, Ser: 1.07 mg/dL — ABNORMAL HIGH (ref 0.57–1.00)
GFR calc non Af Amer: 47 mL/min/{1.73_m2} — ABNORMAL LOW (ref >59)
Globulin, Total: 2.3 g/dL (ref 1.5–4.5)
Glucose: 83 mg/dL (ref 65–99)
Sodium: 142 mmol/L (ref 134–144)
Total Protein: 6.3 g/dL (ref 6.0–8.5)

## 2013-09-01 MED ORDER — ATORVASTATIN CALCIUM 20 MG PO TABS: 20.0000 mg | ORAL_TABLET | Freq: Every day | ORAL | Status: DC

## 2013-09-01 NOTE — Telephone Encounter (Signed)
Seen 08/31/13  MMM   This med not on EPIC list

## 2013-09-01 NOTE — Telephone Encounter (Signed)
ntbs for labs 

## 2013-09-01 NOTE — Telephone Encounter (Signed)
Seen 08/31/13  MMM  Last lipid 01/12/13

## 2013-09-03 ENCOUNTER — Telehealth: Payer: Self-pay | Admitting: Nurse Practitioner

## 2013-09-29 ENCOUNTER — Other Ambulatory Visit: Payer: Self-pay | Admitting: Nurse Practitioner

## 2013-10-02 ENCOUNTER — Telehealth: Payer: Self-pay | Admitting: Nurse Practitioner

## 2013-10-02 NOTE — Telephone Encounter (Signed)
Last seen 08/31/13  MMM  Last lipid 01/12/13 

## 2013-10-31 ENCOUNTER — Other Ambulatory Visit: Payer: Self-pay | Admitting: Nurse Practitioner

## 2013-11-03 ENCOUNTER — Other Ambulatory Visit: Payer: Self-pay

## 2013-11-03 DIAGNOSIS — E039 Hypothyroidism, unspecified: Secondary | ICD-10-CM

## 2013-11-03 MED ORDER — LEVOTHYROXINE SODIUM 25 MCG PO TABS: 25.0000 ug | ORAL_TABLET | Freq: Every day | ORAL | Status: AC

## 2013-11-03 NOTE — Telephone Encounter (Signed)
Last seen 08/31/13  MMM  Last lipid 01/12/13

## 2013-11-03 NOTE — Telephone Encounter (Signed)
No labs since

## 2013-11-16 ENCOUNTER — Inpatient Hospital Stay (HOSPITAL_COMMUNITY)
Admission: EM | Admit: 2013-11-16 | Discharge: 2013-11-19 | DRG: 641 | Disposition: A | Discharge status: Skilled Nursing Facility | Payer: Medicare Other | Attending: Internal Medicine | Admitting: Internal Medicine

## 2013-11-16 ENCOUNTER — Encounter (HOSPITAL_COMMUNITY): Payer: Self-pay | Admitting: Emergency Medicine

## 2013-11-16 ENCOUNTER — Emergency Department (HOSPITAL_COMMUNITY): Payer: Medicare Other

## 2013-11-16 DIAGNOSIS — M899 Disorder of bone, unspecified: Secondary | ICD-10-CM | POA: Diagnosis present

## 2013-11-16 DIAGNOSIS — F419 Anxiety disorder, unspecified: Secondary | ICD-10-CM

## 2013-11-16 DIAGNOSIS — Y92009 Unspecified place in unspecified non-institutional (private) residence as the place of occurrence of the external cause: Secondary | ICD-10-CM

## 2013-11-16 DIAGNOSIS — G589 Mononeuropathy, unspecified: Secondary | ICD-10-CM | POA: Diagnosis present

## 2013-11-16 DIAGNOSIS — W19XXXA Unspecified fall, initial encounter: Secondary | ICD-10-CM | POA: Diagnosis present

## 2013-11-16 DIAGNOSIS — D72829 Elevated white blood cell count, unspecified: Secondary | ICD-10-CM | POA: Diagnosis present

## 2013-11-16 DIAGNOSIS — E039 Hypothyroidism, unspecified: Secondary | ICD-10-CM | POA: Diagnosis present

## 2013-11-16 DIAGNOSIS — G8929 Other chronic pain: Secondary | ICD-10-CM | POA: Diagnosis present

## 2013-11-16 DIAGNOSIS — Z880 Allergy status to penicillin: Secondary | ICD-10-CM

## 2013-11-16 DIAGNOSIS — Z79899 Other long term (current) drug therapy: Secondary | ICD-10-CM

## 2013-11-16 DIAGNOSIS — M949 Disorder of cartilage, unspecified: Secondary | ICD-10-CM

## 2013-11-16 DIAGNOSIS — I517 Cardiomegaly: Secondary | ICD-10-CM | POA: Diagnosis present

## 2013-11-16 DIAGNOSIS — E876 Hypokalemia: Principal | ICD-10-CM | POA: Diagnosis present

## 2013-11-16 DIAGNOSIS — Z96649 Presence of unspecified artificial hip joint: Secondary | ICD-10-CM

## 2013-11-16 DIAGNOSIS — F411 Generalized anxiety disorder: Secondary | ICD-10-CM

## 2013-11-16 DIAGNOSIS — I10 Essential (primary) hypertension: Secondary | ICD-10-CM | POA: Diagnosis present

## 2013-11-16 DIAGNOSIS — M1711 Unilateral primary osteoarthritis, right knee: Secondary | ICD-10-CM | POA: Diagnosis present

## 2013-11-16 DIAGNOSIS — M6282 Rhabdomyolysis: Secondary | ICD-10-CM | POA: Diagnosis present

## 2013-11-16 DIAGNOSIS — R296 Repeated falls: Secondary | ICD-10-CM | POA: Diagnosis present

## 2013-11-16 DIAGNOSIS — I4891 Unspecified atrial fibrillation: Secondary | ICD-10-CM

## 2013-11-16 DIAGNOSIS — F0390 Unspecified dementia without behavioral disturbance: Secondary | ICD-10-CM | POA: Diagnosis present

## 2013-11-16 DIAGNOSIS — M1710 Unilateral primary osteoarthritis, unspecified knee: Secondary | ICD-10-CM | POA: Diagnosis present

## 2013-11-16 DIAGNOSIS — R269 Unspecified abnormalities of gait and mobility: Secondary | ICD-10-CM | POA: Diagnosis present

## 2013-11-16 DIAGNOSIS — G629 Polyneuropathy, unspecified: Secondary | ICD-10-CM | POA: Diagnosis present

## 2013-11-16 LAB — URINE MICROSCOPIC-ADD ON

## 2013-11-16 LAB — CBC WITH DIFFERENTIAL/PLATELET
Basophils Absolute: 0.1 10*3/uL (ref 0.0–0.1)
Basophils Relative: 1 % (ref 0–1)
EOS ABS: 0.2 10*3/uL (ref 0.0–0.7)
EOS PCT: 2 % (ref 0–5)
HCT: 38.3 % (ref 36.0–46.0)
HEMOGLOBIN: 12.9 g/dL (ref 12.0–15.0)
Lymphocytes Relative: 16 % (ref 12–46)
Lymphs Abs: 1.9 10*3/uL (ref 0.7–4.0)
MCH: 30 pg (ref 26.0–34.0)
MCHC: 33.7 g/dL (ref 30.0–36.0)
MCV: 89.1 fL (ref 78.0–100.0)
MONOS PCT: 8 % (ref 3–12)
Monocytes Absolute: 1 10*3/uL (ref 0.1–1.0)
Neutro Abs: 8.7 10*3/uL — ABNORMAL HIGH (ref 1.7–7.7)
Neutrophils Relative %: 74 % (ref 43–77)
Platelets: 300 10*3/uL (ref 150–400)
RBC: 4.3 MIL/uL (ref 3.87–5.11)
RDW: 13.4 % (ref 11.5–15.5)
WBC: 11.8 10*3/uL — ABNORMAL HIGH (ref 4.0–10.5)

## 2013-11-16 LAB — URINALYSIS, ROUTINE W REFLEX MICROSCOPIC
Bilirubin Urine: NEGATIVE
GLUCOSE, UA: NEGATIVE mg/dL
Ketones, ur: NEGATIVE mg/dL
LEUKOCYTES UA: NEGATIVE
NITRITE: NEGATIVE
Protein, ur: NEGATIVE mg/dL
SPECIFIC GRAVITY, URINE: 1.01 (ref 1.005–1.030)
Urobilinogen, UA: 0.2 mg/dL (ref 0.0–1.0)
pH: 8 (ref 5.0–8.0)

## 2013-11-16 LAB — COMPREHENSIVE METABOLIC PANEL
ALBUMIN: 3.7 g/dL (ref 3.5–5.2)
ALT: 23 U/L (ref 0–35)
AST: 45 U/L — ABNORMAL HIGH (ref 0–37)
Alkaline Phosphatase: 94 U/L (ref 39–117)
BUN: 14 mg/dL (ref 6–23)
CALCIUM: 9.5 mg/dL (ref 8.4–10.5)
CO2: 29 mEq/L (ref 19–32)
Chloride: 101 mEq/L (ref 96–112)
Creatinine, Ser: 0.95 mg/dL (ref 0.50–1.10)
GFR calc non Af Amer: 52 mL/min — ABNORMAL LOW (ref >90)
GFR, EST AFRICAN AMERICAN: 61 mL/min — AB (ref >90)
Glucose, Bld: 99 mg/dL (ref 70–99)
Potassium: 3 mEq/L — ABNORMAL LOW (ref 3.7–5.3)
Sodium: 144 mEq/L (ref 137–147)
TOTAL PROTEIN: 7.1 g/dL (ref 6.0–8.3)
Total Bilirubin: 1.5 mg/dL — ABNORMAL HIGH (ref 0.3–1.2)

## 2013-11-16 LAB — TROPONIN I: Troponin I: <0.3 ng/mL (ref <0.30)

## 2013-11-16 LAB — CK: CK TOTAL: 1089 U/L — AB (ref 7–177)

## 2013-11-16 MED ORDER — POTASSIUM CHLORIDE CRYS ER 20 MEQ PO TBCR
40.0000 meq | EXTENDED_RELEASE_TABLET | Freq: Once | ORAL | Status: AC
  Administered 2013-11-16: 40 meq via ORAL
  Filled 2013-11-16: qty 2

## 2013-11-16 MED ORDER — HYDROCODONE-ACETAMINOPHEN 5-325 MG PO TABS: 1.0000 | ORAL_TABLET | ORAL | Status: DC | PRN

## 2013-11-16 MED ORDER — ACETAMINOPHEN 650 MG RE SUPP: 650.0000 mg | Freq: Four times a day (QID) | RECTAL | Status: DC | PRN

## 2013-11-16 MED ORDER — ONDANSETRON HCL 4 MG/2ML IJ SOLN: 4.0000 mg | Freq: Four times a day (QID) | INTRAMUSCULAR | Status: DC | PRN

## 2013-11-16 MED ORDER — FENTANYL CITRATE 0.05 MG/ML IJ SOLN
50.0000 ug | Freq: Once | INTRAMUSCULAR | Status: AC
  Administered 2013-11-16: 50 ug via INTRAVENOUS
  Filled 2013-11-16: qty 2

## 2013-11-16 MED ORDER — HEPARIN SODIUM (PORCINE) 5000 UNIT/ML IJ SOLN
5000.0000 [IU] | Freq: Three times a day (TID) | INTRAMUSCULAR | Status: DC
  Administered 2013-11-16 – 2013-11-17 (×2): 5000 [IU] via SUBCUTANEOUS
  Filled 2013-11-16 (×5): qty 1

## 2013-11-16 MED ORDER — ONDANSETRON HCL 4 MG PO TABS: 4.0000 mg | ORAL_TABLET | Freq: Four times a day (QID) | ORAL | Status: DC | PRN

## 2013-11-16 MED ORDER — TRAZODONE HCL 50 MG PO TABS
50.0000 mg | ORAL_TABLET | Freq: Every evening | ORAL | Status: DC | PRN
  Administered 2013-11-16 – 2013-11-18 (×3): 50 mg via ORAL
  Filled 2013-11-16 (×3): qty 1

## 2013-11-16 MED ORDER — LEVOTHYROXINE SODIUM 25 MCG PO TABS
25.0000 ug | ORAL_TABLET | Freq: Every day | ORAL | Status: DC
  Administered 2013-11-17 – 2013-11-19 (×3): 25 ug via ORAL
  Filled 2013-11-16 (×4): qty 1

## 2013-11-16 MED ORDER — SODIUM CHLORIDE 0.9 % IV SOLN
INTRAVENOUS | Status: AC
  Administered 2013-11-16: 19:00:00 via INTRAVENOUS

## 2013-11-16 MED ORDER — AMLODIPINE BESYLATE 5 MG PO TABS
5.0000 mg | ORAL_TABLET | Freq: Every day | ORAL | Status: DC
  Administered 2013-11-16 – 2013-11-19 (×4): 5 mg via ORAL
  Filled 2013-11-16 (×4): qty 1

## 2013-11-16 MED ORDER — BENAZEPRIL HCL 40 MG PO TABS
40.0000 mg | ORAL_TABLET | Freq: Every day | ORAL | Status: DC
  Administered 2013-11-16 – 2013-11-19 (×4): 40 mg via ORAL
  Filled 2013-11-16 (×4): qty 1

## 2013-11-16 MED ORDER — GABAPENTIN 300 MG PO CAPS
300.0000 mg | ORAL_CAPSULE | Freq: Two times a day (BID) | ORAL | Status: DC
  Administered 2013-11-16 – 2013-11-19 (×6): 300 mg via ORAL
  Filled 2013-11-16 (×7): qty 1

## 2013-11-16 MED ORDER — ACETAMINOPHEN 325 MG PO TABS: 650.0000 mg | ORAL_TABLET | Freq: Four times a day (QID) | ORAL | Status: DC | PRN

## 2013-11-16 MED ORDER — POTASSIUM CHLORIDE CRYS ER 20 MEQ PO TBCR
40.0000 meq | EXTENDED_RELEASE_TABLET | Freq: Every day | ORAL | Status: DC
  Administered 2013-11-16 – 2013-11-17 (×2): 40 meq via ORAL
  Filled 2013-11-16 (×2): qty 2

## 2013-11-16 MED ORDER — SODIUM CHLORIDE 0.9 % IV BOLUS (SEPSIS)
1000.0000 mL | Freq: Once | INTRAVENOUS | Status: AC
Start: 2013-11-16 — End: 2013-11-16
  Administered 2013-11-16: 1000 mL via INTRAVENOUS

## 2013-11-16 MED ORDER — KCL IN DEXTROSE-NACL 10-5-0.45 MEQ/L-%-% IV SOLN
INTRAVENOUS | Status: DC
  Administered 2013-11-16: 21:00:00 via INTRAVENOUS
  Filled 2013-11-16 (×3): qty 1000

## 2013-11-16 NOTE — ED Provider Notes (Signed)
Medical screening examination/treatment/procedure(s) were conducted as a shared visit with non-physician practitioner(s) and myself.  I personally evaluated the patient during the encounter.  EKG Interpretation    Date/Time:  Monday November 16 2013 15:47:36 EST Ventricular Rate:  59 PR Interval:  165 QRS Duration: 94 QT Interval:  472 QTC Calculation: 468 R Axis:   40 Text Interpretation:  Sinus rhythm Supraventricular bigeminy Borderline low voltage, extremity leads Nonspecific T abnormalities, lateral leads No significant change since last tracing Confirmed by WARD  DO, KRISTEN ([id number]) on 11/16/2013 3:56:21 PM            Pt is a 80 y.o. F with h/o hypertension, hypothyroidism, dementia who presents emergency department the EMS from home after she was found on the floor by a home health nurse this morning. Given patient's dementia, she is unable to provide much history. She does state that she is having pain in her right hip but her left lower extremity appears shortened and externally rotated. She has tenderness to palpation over bilateral hips pelvis is stable. She is neurovascular intact distally. Small areas of ecchymosis to the right knee. No other sign of injury on exam. Will obtain labs, urine, imaging.    Imaging shows no acute injury the patient does have elevated CK but normal creatinine. Will admit for IV hydration.  Layla MawKristen N Ward, DO 11/16/13 [phone]

## 2013-11-16 NOTE — ED Provider Notes (Signed)
CSN: [id number]     Arrival date & time 11/16/13  1509 History   First MD Initiated Contact with Patient 11/16/13 1514     Chief Complaint  Patient presents with  . Fall   (Consider location/radiation/quality/duration/timing/severity/associated sxs/prior Treatment) HPI Comments: Patient is an 80 year old female past medical history significant for hypertension, dementia, hypothyroidism, osteopenia presented to the emergency department after being found on the ground at home by her home health aid. Per EMS the patient had been on the floor for approximately 6 hours. Patient states she does not recall falling. She is currently complaining of constant severe right knee pain. Patient is a level V caveat d/t dementia.   Patient is a 80 y.o. female presenting with fall.  Fall    Past Medical History  Diagnosis Date  . HTN (hypertension)   . Dementia   . Hypothyroid   . Neuropathy   . UTI (urinary tract infection)   . Chronic pain   . Osteopenia   . Insomnia    Past Surgical History  Procedure Laterality Date  . Appendectomy    . Knee surgery    . Total hip arthroplasty     Family History  Problem Relation Age of Onset  . Hypertension    . Alzheimer's disease Mother    History  Substance Use Topics  . Smoking status: Never Smoker   . Smokeless tobacco: Never Used  . Alcohol Use: No   OB History   Grav Para Term Preterm Abortions TAB SAB Ect Mult Living                 Review of Systems  Unable to perform ROS: Dementia    Allergies  Penicillins  Home Medications   No current outpatient prescriptions on file. BP 144/56  Pulse 58  Temp(Src) 97.8 F (36.6 C) (Oral)  Resp 16  SpO2 94% Physical Exam  Constitutional: She appears well-developed and well-nourished. No distress.  HENT:  Head: Normocephalic and atraumatic.  Right Ear: External ear normal.  Left Ear: External ear normal.  Nose: Nose normal.  Mouth/Throat: Uvula is midline and oropharynx is clear  and moist. Mucous membranes are dry. No oropharyngeal exudate.  Eyes: Conjunctivae and EOM are normal. Pupils are equal, round, and reactive to light.  Neck: Normal range of motion. Neck supple. No spinous process tenderness and no muscular tenderness present.  Cardiovascular: Normal rate, regular rhythm, normal heart sounds and intact distal pulses.   Pulmonary/Chest: Effort normal and breath sounds normal. No respiratory distress.  Abdominal: Soft. There is no tenderness.  Musculoskeletal: She exhibits tenderness.       Right hip: She exhibits decreased range of motion, decreased strength and tenderness.       Left hip: She exhibits decreased range of motion, decreased strength and tenderness.       Right knee: She exhibits decreased range of motion, swelling and ecchymosis. She exhibits no laceration. Tenderness found.       Left knee: She exhibits decreased range of motion and swelling. Tenderness found.       Right ankle: Normal.       Left ankle: Normal.       Right upper leg: She exhibits tenderness.       Left upper leg: She exhibits tenderness.       Right foot: Normal.       Left foot: Normal.  Left leg shortened and externally rotated  Neurological: She is alert. No cranial nerve deficit or sensory  deficit. GCS eye subscore is 4. GCS verbal subscore is 5. GCS motor subscore is 6.  UE strength 5/5. Dorsiflexion and plantarflexion strength 5/5 Oriented to self and place.  No pronator drift.   Skin: Skin is warm and dry. She is not diaphoretic.    ED Course  Procedures (including critical care time) Medications  0.9 %  sodium chloride infusion ( Intravenous New Bag/Given 11/16/13 1848)  levothyroxine (SYNTHROID, LEVOTHROID) tablet 25 mcg (not administered)  gabapentin (NEURONTIN) capsule 300 mg (300 mg Oral Given 11/16/13 2103)  traZODone (DESYREL) tablet 50 mg (50 mg Oral Given 11/16/13 2104)  potassium chloride SA (K-DUR,KLOR-CON) CR tablet 40 mEq (40 mEq Oral Given 11/16/13  2103)  amLODipine (NORVASC) tablet 5 mg (5 mg Oral Given 11/16/13 2104)  benazepril (LOTENSIN) tablet 40 mg (40 mg Oral Given 11/16/13 2104)  heparin injection 5,000 Units (5,000 Units Subcutaneous Given 11/16/13 2106)  dextrose 5 % and 0.45 % NaCl with KCl 10 mEq/L infusion ( Intravenous New Bag/Given 11/16/13 2103)  acetaminophen (TYLENOL) tablet 650 mg (not administered)    Or  acetaminophen (TYLENOL) suppository 650 mg (not administered)  HYDROcodone-acetaminophen (NORCO/VICODIN) 5-325 MG per tablet 1-2 tablet (not administered)  ondansetron (ZOFRAN) tablet 4 mg (not administered)    Or  ondansetron (ZOFRAN) injection 4 mg (not administered)  fentaNYL (SUBLIMAZE) injection 50 mcg (50 mcg Intravenous Given 11/16/13 1615)  sodium chloride 0.9 % bolus 1,000 mL (0 mLs Intravenous Stopped 11/16/13 1835)  potassium chloride SA (K-DUR,KLOR-CON) CR tablet 40 mEq (40 mEq Oral Given 11/16/13 1843)    Labs Review Labs Reviewed  CK - Abnormal; Notable for the following:    Total CK 1089 (*)    All other components within normal limits  CBC WITH DIFFERENTIAL - Abnormal; Notable for the following:    WBC 11.8 (*)    Neutro Abs 8.7 (*)    All other components within normal limits  COMPREHENSIVE METABOLIC PANEL - Abnormal; Notable for the following:    Potassium 3.0 (*)    AST 45 (*)    Total Bilirubin 1.5 (*)    GFR calc non Af Amer 52 (*)    GFR calc Af Amer 61 (*)    All other components within normal limits  URINALYSIS, ROUTINE W REFLEX MICROSCOPIC - Abnormal; Notable for the following:    APPearance CLOUDY (*)    Hgb urine dipstick TRACE (*)    All other components within normal limits  URINE CULTURE  TROPONIN I  URINE MICROSCOPIC-ADD ON  TSH   Imaging Review Dg Chest 2 View  11/16/2013   CLINICAL DATA:  Fall.  Shortness of breath.  EXAM: CHEST  2 VIEW  COMPARISON:  CT and Plain film of the chest 08/27/2013.  FINDINGS: There is cardiomegaly without edema. Lungs are clear. No  pneumothorax or pleural effusion. No focal bony abnormality.  IMPRESSION: Cardiomegaly without acute disease.   Electronically Signed   By: Drusilla Kannerhomas  Dalessio M.D.   On: 11/16/2013 18:07   Dg Hip Bilateral W/pelvis  11/16/2013   CLINICAL DATA:  Status post fall.  Left hip surgery.  EXAM: BILATERAL HIP WITH PELVIS - 4+ VIEW  COMPARISON:  Single view of the pelvis 02/25/2010.  FINDINGS: Remote healed left intertrochanteric fracture with fixation hardware in place is noted. There is no acute bony or joint abnormality. Mild degenerative change is seen about the hips.  IMPRESSION: No acute finding.  Remote healed left intertrochanteric fracture with fixation hardware in place.   Electronically Signed  By: Drusilla Kanner M.D.   On: 11/16/2013 17:52   Ct Head Wo Contrast  11/16/2013   CLINICAL DATA:  FALL  EXAM: CT HEAD WITHOUT CONTRAST  TECHNIQUE: Contiguous axial images were obtained from the base of the skull through the vertex without intravenous contrast.  COMPARISON:  CT HEAD W/O CM dated 08/28/2013; CT HEAD W/O CM dated 01/07/2013; CT HEAD W/O CM dated 11/20/2010  FINDINGS: There is no evidence of mass effect, midline shift, or extra-axial fluid collections. There is no evidence of a space-occupying lesion or intracranial hemorrhage. There is no evidence of a cortical-based area of acute infarction. There is generalized cerebral atrophy. There is periventricular white matter low attenuation likely secondary to microangiopathy.  The ventricles and sulci are appropriate for the patient's age. The basal cisterns are patent.  Visualized portions of the orbits are unremarkable. The visualized portions of the paranasal sinuses and mastoid air cells are unremarkable. Cerebrovascular atherosclerotic calcifications are noted.  The osseous structures are unremarkable.  IMPRESSION: No acute intracranial pathology.   Electronically Signed   By: Elige Ko   On: 11/16/2013 17:28   Ct Cervical Spine Wo  Contrast  11/16/2013   CLINICAL DATA:  FALL  EXAM: CT CERVICAL SPINE WITHOUT CONTRAST  TECHNIQUE: Multidetector CT imaging of the cervical spine was performed without intravenous contrast. Multiplanar CT image reconstructions were also generated.  COMPARISON:  None.  FINDINGS: The alignment is anatomic. The vertebral body heights are maintained. There is no acute fracture. There is no static listhesis. The prevertebral soft tissues are normal. The intraspinal soft tissues are not fully imaged on this examination due to poor soft tissue contrast, but there is no gross soft tissue abnormality.  There is degenerative disc disease of the cervical spine most significant at C4-5, C5-6 and C6-7. There is mild broad-based disc osteophyte complexes that C4-5, C5-6 and C6-7. There is bilateral foraminal stenosis at C4-5 with bilateral facet arthropathy, left greater than right.  The visualized portions of the lung apices demonstrate no focal abnormality.  IMPRESSION: No acute osseous injury of the cervical spine.   Electronically Signed   By: Elige Ko   On: 11/16/2013 17:30    EKG Interpretation    Date/Time:  Monday November 16 2013 15:47:36 EST Ventricular Rate:  59 PR Interval:  165 QRS Duration: 94 QT Interval:  472 QTC Calculation: 468 R Axis:   40 Text Interpretation:  Sinus rhythm Supraventricular bigeminy Borderline low voltage, extremity leads Nonspecific T abnormalities, lateral leads No significant change since last tracing Confirmed by WARD  DO, KRISTEN ([id number]) on 11/16/2013 3:56:21 PM            MDM   1. Fall   2. Dementia   3. Anxiety   4. Fall at home   5. HTN (hypertension)     Filed Vitals:   11/16/13 1955  BP: 144/56  Pulse: 58  Temp: 97.8 F (36.6 C)  Resp: 16    Afebrile, NAD, non-toxic appearing, AAOx2.  No neurofocal deficits. Left leg is shortened and externally rotated. Neurovascularly intact. Normal sensation. I have reviewed nursing notes, vital signs, and  all appropriate lab and imaging results for this patient. Patient with fall, elevated CK w/o increase in BUN and Creatinine. Will admit patient for further management and evaluation. Patient d/w with Dr. Elesa Massed, agrees with plan.         Jeannetta Ellis, PA-C 11/16/13 2328

## 2013-11-16 NOTE — Progress Notes (Signed)
Utilization Review completed.  Naasir Carreira RN CM  

## 2013-11-16 NOTE — H&P (Addendum)
[facility]                                                                                    Patient Demographics  Casey AnoCatherine Litt, is a 80 y.o. female  CSN: [id number]  MRN: [id number]  DOB - 12/25/1923  Admit Date - 11/16/2013  Outpatient Primary MD for the patient is Rudi HeapMOORE, DONALD, MD   With History of -  Past Medical History  Diagnosis Date  . HTN (hypertension)   . Dementia   . Hypothyroid   . Neuropathy   . UTI (urinary tract infection)   . Chronic pain   . Osteopenia   . Insomnia       Past Surgical History  Procedure Laterality Date  . Appendectomy    . Knee surgery    . Total hip arthroplasty      in for   Chief Complaint  Patient presents with  . Fall     HPI  Casey Vasquez  is a 80 y.o. female, with past medical history significant for advanced dementia hypothyroidism and hypertension presenting today after he was found at home on the ground where she was lying for about 6 hours. Family reported that the patient fell and could not get up for 6 hours. Patient  could not give any history due to to advanced dementia . She usually lives at home with around 2-3 hours supervision a day from family members and she has a Comptrollersitter as well. Complains of hip pain however x-ray of the left hip was negative for acute fracture. In the emergency room she was found to have hypokalemia and I was called to admit    Review of Systems    Unable to obtain due to patient's advanced dementia  Social History History  Substance Use Topics  . Smoking status: Never Smoker   . Smokeless tobacco: Never Used  . Alcohol Use: No     Family History Family History  Problem Relation Age of Onset  . Hypertension    . Alzheimer's disease Mother      Prior to Admission medications   Medication Sig Start Date End Date Taking? Authorizing Provider  amLODipine (NORVASC) 5 MG tablet Take 1 tablet (5 mg total) by mouth daily. 01/12/13 01/12/14  Mary-Margaret  Daphine DeutscherMartin, FNP  atorvastatin (LIPITOR) 20 MG tablet TAKE ONE TABLET BY MOUTH ONCE DAILY 10/31/13   Mary-Margaret Daphine DeutscherMartin, FNP  benazepril (LOTENSIN) 40 MG tablet Take 1 tablet (40 mg total) by mouth daily. 01/12/13   Mary-Margaret Daphine DeutscherMartin, FNP  calcium citrate-vitamin D (CITRACAL+D) 315-200 MG-UNIT per tablet Take 1 tablet by mouth 2 (two) times daily.     Historical Provider, MD  cholecalciferol (VITAMIN D) 1000 UNITS tablet Take 1,000 Units by mouth daily.    Historical Provider, MD  diclofenac sodium (VOLTAREN) 1 % GEL Apply 2 g topically 4 (four) times daily. 08/31/13   Mary-Margaret Daphine DeutscherMartin, FNP  gabapentin (NEURONTIN) 300 MG capsule Take 1 capsule (300 mg total) by mouth 3 (three) times daily. 08/31/13   Mary-Margaret Daphine DeutscherMartin, FNP  levofloxacin (LEVAQUIN) 500 MG tablet Take 1 tablet (500 mg total) by mouth daily. 08/28/13   Trevor IhaIskra M  Izola Price, MD  levothyroxine (SYNTHROID, LEVOTHROID) 25 MCG tablet Take 1 tablet (25 mcg total) by mouth daily. 11/03/13   [name], FNP  potassium chloride SA (K-DUR,KLOR-CON) 20 MEQ tablet Take 2 tablets (40 mEq total) by mouth daily. 08/28/13   Dorothea Ogle, MD  traZODone (DESYREL) 50 MG tablet Take 1 tablet (50 mg total) by mouth at bedtime as needed for sleep. 08/31/13   [name], FNP    Allergies  Allergen Reactions  . Penicillins Rash    uknown    Physical Exam  Vitals  Blood pressure 150/58, pulse 62, temperature 98.1 F (36.7 C), temperature source Oral, resp. rate 16, SpO2 100.00%.   1. General elderly white American female, pleasantly confused  2. Normal affect and insight, Not Suicidal or Homicidal,  3. No F.N deficits, ALL C.Nerves Intact, Strength 5/5 all 4 extremities, Sensation intact all 4 extremities, Plantars down going.  4. Ears and Eyes appear Normal, Conjunctivae clear, PERRLA. Moist Oral Mucosa.  5. Supple Neck, No JVD, No cervical lymphadenopathy appriciated, No Carotid Bruits.  6. Symmetrical Chest wall movement,  Good air movement bilaterally, CTAB.  7. RRR, No Gallops, Rubs or Murmurs, No Parasternal Heave.  8. Positive Bowel Sounds, Abdomen Soft, Non tender, No organomegaly appriciated,No rebound -guarding or rigidity.  9.  No Cyanosis, Normal Skin Turgor, No Skin Rash or Bruise.  10. Good muscle tone,  joints appear normal , no effusions, Normal ROM.  11. No Palpable Lymph Nodes in Neck or Axillae    Data Review  CBC  Recent Labs Lab 11/16/13 1608  WBC 11.8*  HGB 12.9  HCT 38.3  PLT 300  MCV 89.1  MCH 30.0  MCHC 33.7  RDW 13.4  LYMPHSABS 1.9  MONOABS 1.0  EOSABS 0.2  BASOSABS 0.1   ------------------------------------------------------------------------------------------------------------------  Chemistries   Recent Labs Lab 11/16/13 1608  NA 144  K 3.0*  CL 101  CO2 29  GLUCOSE 99  BUN 14  CREATININE 0.95  CALCIUM 9.5  AST 45*  ALT 23  ALKPHOS 94  BILITOT 1.5*   Urinalysis    Component Value Date/Time   COLORURINE YELLOW 11/16/2013 1701   APPEARANCEUR CLOUDY* 11/16/2013 1701   LABSPEC 1.010 11/16/2013 1701   PHURINE 8.0 11/16/2013 1701   GLUCOSEU NEGATIVE 11/16/2013 1701   HGBUR TRACE* 11/16/2013 1701   BILIRUBINUR NEGATIVE 11/16/2013 1701   KETONESUR NEGATIVE 11/16/2013 1701   PROTEINUR NEGATIVE 11/16/2013 1701   UROBILINOGEN 0.2 11/16/2013 1701   NITRITE NEGATIVE 11/16/2013 1701   LEUKOCYTESUR NEGATIVE 11/16/2013 1701    ----------------------------------------------------------------------------------------------------------------    Imaging results:   Dg Chest 2 View  11/16/2013   CLINICAL DATA:  Fall.  Shortness of breath.  EXAM: CHEST  2 VIEW  COMPARISON:  CT and Plain film of the chest 08/27/2013.  FINDINGS: There is cardiomegaly without edema. Lungs are clear. No pneumothorax or pleural effusion. No focal bony abnormality.  IMPRESSION: Cardiomegaly without acute disease.   Electronically Signed   By: Drusilla Kanner M.D.   On: 11/16/2013 18:07    Dg Hip Bilateral W/pelvis  11/16/2013   CLINICAL DATA:  Status post fall.  Left hip surgery.  EXAM: BILATERAL HIP WITH PELVIS - 4+ VIEW  COMPARISON:  Single view of the pelvis 02/25/2010.  FINDINGS: Remote healed left intertrochanteric fracture with fixation hardware in place is noted. There is no acute bony or joint abnormality. Mild degenerative change is seen about the hips.  IMPRESSION: No acute finding.  Remote healed left intertrochanteric  fracture with fixation hardware in place.   Electronically Signed   By: Drusilla Kanner M.D.   On: 11/16/2013 17:52   Ct Head Wo Contrast  11/16/2013   CLINICAL DATA:  FALL  EXAM: CT HEAD WITHOUT CONTRAST  TECHNIQUE: Contiguous axial images were obtained from the base of the skull through the vertex without intravenous contrast.  COMPARISON:  CT HEAD W/O CM dated 08/28/2013; CT HEAD W/O CM dated 01/07/2013; CT HEAD W/O CM dated 11/20/2010  FINDINGS: There is no evidence of mass effect, midline shift, or extra-axial fluid collections. There is no evidence of a space-occupying lesion or intracranial hemorrhage. There is no evidence of a cortical-based area of acute infarction. There is generalized cerebral atrophy. There is periventricular white matter low attenuation likely secondary to microangiopathy.  The ventricles and sulci are appropriate for the patient's age. The basal cisterns are patent.  Visualized portions of the orbits are unremarkable. The visualized portions of the paranasal sinuses and mastoid air cells are unremarkable. Cerebrovascular atherosclerotic calcifications are noted.  The osseous structures are unremarkable.  IMPRESSION: No acute intracranial pathology.   Electronically Signed   By: Elige Ko   On: 11/16/2013 17:28   Ct Cervical Spine Wo Contrast  11/16/2013   CLINICAL DATA:  FALL  EXAM: CT CERVICAL SPINE WITHOUT CONTRAST  TECHNIQUE: Multidetector CT imaging of the cervical spine was performed without intravenous contrast. Multiplanar CT  image reconstructions were also generated.  COMPARISON:  None.  FINDINGS: The alignment is anatomic. The vertebral body heights are maintained. There is no acute fracture. There is no static listhesis. The prevertebral soft tissues are normal. The intraspinal soft tissues are not fully imaged on this examination due to poor soft tissue contrast, but there is no gross soft tissue abnormality.  There is degenerative disc disease of the cervical spine most significant at C4-5, C5-6 and C6-7. There is mild broad-based disc osteophyte complexes that C4-5, C5-6 and C6-7. There is bilateral foraminal stenosis at C4-5 with bilateral facet arthropathy, left greater than right.  The visualized portions of the lung apices demonstrate no focal abnormality.  IMPRESSION: No acute osseous injury of the cervical spine.   Electronically Signed   By: Elige Ko   On: 11/16/2013 17:30    My personal review of EKG: Rhythm NSR, PACs with nonspecific ST changes  Assessment & Plan  1. fall at home 2. Hypokalemia 3. Advanced dementia 4. History of hypertension 5. History of hypothyroidism 6. History of neuropathy 7.rhabdomyolysis 8.left hip pain Plan IV fluids with potassium Physical therapy evaluation and treatment Social worker evaluation and treatment for placement Pain control Check TSH Decrease Neurontin Continue on deseryl Check CT of left hip .   DVT Prophylaxis heparin  AM Labs Ordered, also please review Full Orders  Family Communication: Admission, patients condition and plan of care including tests being ordered have been discussed with the patient and  daughter on the phone who indicate understanding and agree with the plan and Code Status.  Code Status full  Disposition Plan: Assisted living facility  Time spent in minutes : 33 minutes  Condition fair

## 2013-11-16 NOTE — ED Notes (Signed)
Bed: ZO10WA14 Expected date: 11/16/13 Expected time: 2:59 PM Means of arrival:  Comments: EMS hold

## 2013-11-16 NOTE — ED Notes (Signed)
Per EMS- Paient had a fall at home,but was not discovered until [facility] visited. dPatient had been on the floor for approximately 6 hours. Patient has right leg shortening and right knee swelling. Patient does c/o right hip pain.

## 2013-11-17 DIAGNOSIS — I4891 Unspecified atrial fibrillation: Secondary | ICD-10-CM

## 2013-11-17 LAB — BASIC METABOLIC PANEL
BUN: 14 mg/dL (ref 6–23)
CHLORIDE: 104 meq/L (ref 96–112)
CO2: 27 mEq/L (ref 19–32)
CREATININE: 0.89 mg/dL (ref 0.50–1.10)
Calcium: 8.9 mg/dL (ref 8.4–10.5)
GFR calc Af Amer: 66 mL/min — ABNORMAL LOW (ref >90)
GFR calc non Af Amer: 57 mL/min — ABNORMAL LOW (ref >90)
Glucose, Bld: 155 mg/dL — ABNORMAL HIGH (ref 70–99)
Potassium: 3.9 mEq/L (ref 3.7–5.3)
Sodium: 142 mEq/L (ref 137–147)

## 2013-11-17 LAB — URINE CULTURE
COLONY COUNT: NO GROWTH
CULTURE: NO GROWTH

## 2013-11-17 LAB — TSH: TSH: 3.574 u[IU]/mL (ref 0.350–4.500)

## 2013-11-17 MED ORDER — HALOPERIDOL LACTATE 5 MG/ML IJ SOLN: 1.0000 mg | Freq: Once | INTRAMUSCULAR | Status: DC

## 2013-11-17 MED ORDER — HALOPERIDOL LACTATE 5 MG/ML IJ SOLN
0.5000 mg | Freq: Once | INTRAMUSCULAR | Status: AC
  Administered 2013-11-17: 0.5 mg via INTRAMUSCULAR
  Filled 2013-11-17: qty 1

## 2013-11-17 NOTE — Progress Notes (Signed)
Clinical Social Work Department BRIEF PSYCHOSOCIAL ASSESSMENT 11/17/2013  Patient:  Casey Vasquez,Casey Vasquez     Account Number:  [id number]     Admit date:  11/16/2013  Clinical Social Worker:  Jacelyn GripBYRD,SUZANNA, LCSWA  Date/Time:  11/17/2013 12:00 N  Referred by:  Physician  Date Referred:  11/17/2013 Referred for  SNF Placement   Other Referral:   Interview type:  Other - See comment Other interview type:   pt caregiver at bedside    PSYCHOSOCIAL DATA Living Status:  FAMILY Admitted from facility:   Level of care:   Primary support name:  Casey Vasquez/nephew/[phone] Primary support relationship to patient:  FAMILY Degree of support available:   adequate    CURRENT CONCERNS Current Concerns  Post-Acute Placement   Other Concerns:    SOCIAL WORK ASSESSMENT / PLAN CSW received referral for New SNF placement.    CSW visited pt room. Pt private caregiver, Casey Vasquez at bedside. Per caregiver, pt has a caregiver for 4 hours a day and pt nephew lives in basement of home. Pt caregiver discussed that pt daughters live out of town, but are coming in town since pt fall at home. CSW discussed that PT evaluated pt and recommended SNF. Pt caregiver stated that she communicates often with pt daughters and pt daughters do not want SNF placement. Pt daughter plan to arrange 24 hour care for pt at home either via family or private duty care.    CSW provided pt caregiver with RNCM contact information and HH list and private duty agency list in order for pt daughters to communicate with RNCM regarding home needs.    CSW provided CSW contact information in the instance that pt daughters change mind and want to explore SNF.    RNCM notified.    CSW will continue to follow and assist as appropriate.   Assessment/plan status:  Psychosocial Support/Ongoing Assessment of Needs Other assessment/ plan:   discharge planning   Information/referral to community resources:   [facility], [facility]  [facility]    PATIENT'S/FAMILY'S RESPONSE TO PLAN OF CARE: Pt alert and oriented to self. Pt with history of dementia. Pt caregiver reports that pt daughters do not want SNF and are coming into town to arrange 24 hour care for pt at home.    Loletta SpecterSuzanna Kidd, MSW, LCSW Clinical Social Work [phone]

## 2013-11-17 NOTE — Evaluation (Signed)
Physical Therapy Evaluation Patient Details Name: Casey Vasquez MRN: [id number] DOB: 10/13/1924 Today's Date: 11/17/2013 Time: [id number] PT Time Calculation (min): 25 min  PT Assessment / Plan / Recommendation History of Present Illness  Casey Vasquez  is a 80 y.o. female, admitted 11/16/13, , with past medical history significant for advanced dementia hypothyroidism and hypertension presenting today after he was found at home on the ground where she was lying for about 6 hours. Family reported that the patient fell and could not get up for 6 hours. Patient  could not give any history due to to advanced dementia . She usually lives at home with around 2-3 hours supervision a day from family members and she has a Comptrollersitter as well. Complains of hip pain however x-ray of the left hip was negative for acute fracture. In the emergency room she was found to have hypokalemia and I was called to admit  Clinical Impression  Pt  Requiring extensive assist today for transfers. Pt reports painful l knee when attempts to stand. Pt will benefit from PT to address problems listed.    PT Assessment  Patient needs continued PT services    Follow Up Recommendations  SNF    Does the patient have the potential to tolerate intense rehabilitation      Barriers to Discharge        Equipment Recommendations  None recommended by PT    Recommendations for Other Services     Frequency Min 3X/week    Precautions / Restrictions Precautions Precautions: Fall   Pertinent Vitals/Pain L knee to palpate on medial aspect.      Mobility  Transfers Overall transfer level: Needs assistance Equipment used: Rolling walker (2 wheeled) Transfers: Sit to/from [employer] CorporationStand;Stand Pivot Transfers Sit to Stand: +2 physical assistance;+2 safety/equipment Stand pivot transfers: +2 physical assistance;+2 safety/equipment General transfer comment: pt had much difficulty standing from surfaces, required several attempts at RW. Pt  gradually improved and able to [facility]fer from [facility] to recliner. pt then practiced standing from recliner x 2.    Exercises     PT Diagnosis: Difficulty walking;Generalized weakness;Acute pain  PT Problem List: Decreased strength;Decreased activity tolerance;Decreased mobility;Pain;Decreased cognition PT Treatment Interventions: DME instruction;Gait training;Functional mobility training;Therapeutic activities;Therapeutic exercise;Patient/family education     PT Goals(Current goals can be found in the care plan section) Acute Rehab PT Goals Patient Stated Goal: not stated. PT Goal Formulation: Patient unable to participate in goal setting Time For Goal Achievement: 12/01/13 Potential to Achieve Goals: Good  Visit Information  Last PT Received On: 11/17/13 Assistance Needed: +2 History of Present Illness: Casey Vasquez  is a 80 y.o. female, admitted 11/16/13, , with past medical history significant for advanced dementia hypothyroidism and hypertension presenting today after he was found at home on the ground where she was lying for about 6 hours. Family reported that the patient fell and could not get up for 6 hours. Patient  could not give any history due to to advanced dementia . She usually lives at home with around 2-3 hours supervision a day from family members and she has a Comptrollersitter as well. Complains of hip pain however x-ray of the left hip was negative for acute fracture. In the emergency room she was found to have hypokalemia and I was called to admit       Prior Functioning  Home Living Family/patient expects to be discharged to:: Skilled nursing facility Living Arrangements: Other relatives    Cognition  Cognition Arousal/Alertness: Awake/alert Behavior During Therapy:  Anxious Overall Cognitive Status: No family/caregiver present to determine baseline cognitive functioning Memory: Decreased short-term memory;Decreased recall of precautions    Extremity/Trunk Assessment Upper  Extremity Assessment Upper Extremity Assessment: Generalized weakness Lower Extremity Assessment Lower Extremity Assessment: Generalized weakness;LLE deficits/detail LLE Deficits / Details: pt states her L knee is painful and feels it will give out.   Balance Balance Overall balance assessment: Needs assistance Sitting-balance support: Bilateral upper extremity supported;Feet supported Sitting balance-Leahy Scale: Fair Standing balance support: Bilateral upper extremity supported Standing balance-Leahy Scale: Poor  End of Session PT - End of Session Equipment Utilized During Treatment: Gait belt Activity Tolerance: Patient limited by fatigue;Patient limited by pain Patient left: in chair;with call bell/phone within reach;with chair alarm set Nurse Communication: Mobility status  GP     Rada Hay 11/17/2013, 12:01 PM Blanchard Kelch PT [phone]

## 2013-11-17 NOTE — Progress Notes (Signed)
[facility] PROGRESS NOTE  Casey Vasquez ZOX:[id number] DOB: 09/15/1924 DOA: 11/16/2013 PCP: Rudi HeapMOORE, DONALD, MD  Brief narrative: 80 y.o. female, with past medical history significant for advanced dementia, hypothyroidism and hypertension [facility] status post fall at home. In ED, blood pressure was 144/56, heart rate 53, T 98.1 F, oxygen saturation of 94% on room air. Blood work revealed mild leukocytosis of 11.8 and potassium of 3. Chest x-ray revealed cardiomegaly without acute disease. Bilateral hip x-ray showed no acute findings. CT head and cervical spine did not reveal acute intracranial findings.  Assessment/Plan:  Principal Problem:   Fall at home - Secondary to advanced dementia - Appreciate physical therapy evaluation recommendations - plan for discharge to SNF likely in next 24 hours Active Problems:   Hypokalemia - repleted and potassium WNL    Hypertension - continue Norvasc 5 mg daily and Lotensin 40 mg daily - normal renal function    Hypothyroidism - continue synthroid   Code Status: Full code Family Communication: No family at the bedside Disposition Plan: To skilled nursing facility once stable  Manson PasseyEVINE, Tytianna Greenley, MD  [facility] Pager [phone]  If 7PM-7AM, please contact night-coverage [email or web address] Password [email or web address] 11/17/2013, 12:34 PM   LOS: 1 day   Consultants:  None   Procedures:  None   Antibiotics:  None   HPI/Subjective: No acute overnight events.  Objective: Filed Vitals:   11/16/13 1537 11/16/13 1755 11/16/13 1955 11/17/13 0515  BP: 176/60 150/58 144/56 147/60  Pulse: 53 62 58 62  Temp: 98.1 F (36.7 C)  97.8 F (36.6 C) 98.1 F (36.7 C)  TempSrc: Oral  Oral Oral  Resp: 16 16 16 16   SpO2: 100% 100% 94% 100%    Intake/Output Summary (Last 24 hours) at 11/17/13 1234 Last data filed at 11/17/13 1000  Gross per 24 hour  Intake  507.5 ml  Output    600 ml  Net  -92.5 ml     Exam:   General:  Pt is alert, not in acute distress  Cardiovascular: irregular, rate controlled, S1/S2 appreciated   Respiratory: Clear to auscultation bilaterally, no wheezing  Abdomen: Soft, non tender, non distended, bowel sounds present  Extremities: pulses DP and PT palpable bilaterally  Neuro: Grossly nonfocal  Data Reviewed: Basic Metabolic Panel:  Recent Labs Lab 11/16/13 1608 11/17/13 1005  NA 144 142  K 3.0* 3.9  CL 101 104  CO2 29 27  GLUCOSE 99 155*  BUN 14 14  CREATININE 0.95 0.89  CALCIUM 9.5 8.9   Liver Function Tests:  Recent Labs Lab 11/16/13 1608  AST 45*  ALT 23  ALKPHOS 94  BILITOT 1.5*  PROT 7.1  ALBUMIN 3.7   No results found for this basename: LIPASE, AMYLASE,  in the last 168 hours No results found for this basename: AMMONIA,  in the last 168 hours CBC:  Recent Labs Lab 11/16/13 1608  WBC 11.8*  NEUTROABS 8.7*  HGB 12.9  HCT 38.3  MCV 89.1  PLT 300   Cardiac Enzymes:  Recent Labs Lab 11/16/13 1608  CKTOTAL 1089*  TROPONINI <0.30   BNP: No components found with this basename: POCBNP,  CBG: No results found for this basename: GLUCAP,  in the last 168 hours  No results found for this or any previous visit (from the past 240 hour(s)).   Studies: Dg Chest 2 View 11/16/2013    IMPRESSION: Cardiomegaly without acute disease.     Dg Hip Bilateral W/pelvis 11/16/2013  IMPRESSION: No acute finding.  Remote healed left intertrochanteric fracture with fixation hardware in place.     Ct Head Wo Contrast 11/16/2013     IMPRESSION: No acute intracranial pathology.     Ct Cervical Spine Wo Contrast 11/16/2013    IMPRESSION: No acute osseous injury of the cervical spine.   Electronically Signed   By: Elige Ko   On: 11/16/2013 17:30    Scheduled Meds: . amLODipine  5 mg Oral Daily  . benazepril  40 mg Oral Daily  . gabapentin  300 mg Oral BID  . heparin  5,000 Units Subcutaneous Q8H  . levothyroxine  25 mcg Oral  QAC breakfast  . potassium chloride SA  40 mEq Oral Daily   Continuous Infusions: . dextrose 5 % and 0.45 % NaCl with KCl 10 mEq/L Stopped (11/17/13 0050)

## 2013-11-18 ENCOUNTER — Inpatient Hospital Stay (HOSPITAL_COMMUNITY): Payer: Medicare Other

## 2013-11-18 DIAGNOSIS — M1710 Unilateral primary osteoarthritis, unspecified knee: Secondary | ICD-10-CM

## 2013-11-18 DIAGNOSIS — E876 Hypokalemia: Principal | ICD-10-CM

## 2013-11-18 DIAGNOSIS — E039 Hypothyroidism, unspecified: Secondary | ICD-10-CM

## 2013-11-18 DIAGNOSIS — IMO0002 Reserved for concepts with insufficient information to code with codable children: Secondary | ICD-10-CM

## 2013-11-18 DIAGNOSIS — M6282 Rhabdomyolysis: Secondary | ICD-10-CM | POA: Diagnosis present

## 2013-11-18 LAB — CK TOTAL AND CKMB (NOT AT ARMC)
CK, MB: 5.6 ng/mL — ABNORMAL HIGH (ref 0.3–4.0)
Relative Index: 1.9 (ref 0.0–2.5)
Total CK: 296 U/L — ABNORMAL HIGH (ref 7–177)

## 2013-11-18 MED ORDER — LIDOCAINE HCL 1 % IJ SOLN: 10.0000 mL | Freq: Once | INTRAMUSCULAR | Status: DC

## 2013-11-18 MED ORDER — TRIAMCINOLONE ACETONIDE 40 MG/ML IJ SUSP
40.0000 mg | Freq: Once | INTRAMUSCULAR | Status: AC
  Administered 2013-11-18: 40 mg via INTRA_ARTICULAR
  Filled 2013-11-18: qty 1

## 2013-11-18 MED ORDER — LIDOCAINE HCL 1 % IJ SOLN
10.0000 mL | Freq: Once | INTRAMUSCULAR | Status: AC
  Administered 2013-11-18: 10 mL
  Filled 2013-11-18: qty 20

## 2013-11-18 MED ORDER — LIDOCAINE HCL 1 % IJ SOLN
10.0000 mL | Freq: Once | INTRAMUSCULAR | Status: DC
  Filled 2013-11-18: qty 10

## 2013-11-18 MED ORDER — DICLOFENAC SODIUM 1 % TD GEL
2.0000 g | Freq: Four times a day (QID) | TRANSDERMAL | Status: DC
  Administered 2013-11-18 – 2013-11-19 (×4): 2 g via TOPICAL
  Filled 2013-11-18: qty 100

## 2013-11-18 NOTE — Progress Notes (Signed)
Clinical Social Work  CSW spoke with dtr Lupita Leash(Donna) who reports she wants patient placed at SNF at [place]. CSW explained SNF process along with Medicare benefits for SNF. Dtr reports [facility] and would like her to return if possible. CSW completed FL2 and faxed to [facility]. CSW left a message with [facility] in order to determine if they can accept patient at [place]. CSW informed dtr that if [facility] is unable to accept then search would need to be expanded. CSW will contact dtr with [facility] decision.  CSW will continue to follow.  Unk LightningHolly Archer Vise, LCSW (Coverage for [employer]Suzanna Kidd)

## 2013-11-18 NOTE — Consult Note (Signed)
Reason for Consult:right knee pain/swelling  Referring Physician: Rama, FIZA NATION is an 80 y.o. female.  HPI: 80 year old admitted on 11/16/13 s/p fall with hypokalemia, deconditioned status and advanced dementia. Hx of osteoarthritis right knee.  Throughout hospital stay, complained of right knee pain. Consulted for right knee pain and effusion. Xrays significant for tricompartmental arthritis.  Patient reports chronic history of knee pain, worse since fall on 1/26. Worse with mvmt, better with rest. Denies calf pain. Medically treated and in stable condition, likely d/c and SNF placement soon/tomorrow.  Past Medical History  Diagnosis Date  . HTN (hypertension)   . Dementia   . Hypothyroid   . Neuropathy   . UTI (urinary tract infection)   . Chronic pain   . Osteopenia   . Insomnia     Past Surgical History  Procedure Laterality Date  . Appendectomy    . Knee surgery    . Total hip arthroplasty      Family History  Problem Relation Age of Onset  . Hypertension    . Alzheimer's disease Mother     Social History:  reports that she has never smoked. She has never used smokeless tobacco. She reports that she does not drink alcohol or use illicit drugs.  Allergies:  Allergies  Allergen Reactions  . Penicillins Rash    uknown    Medications: I have reviewed the patient's current medications.  Results for orders placed during the hospital encounter of 11/16/13 (from the past 48 hour(s))  TSH     Status: None   Collection Time    11/16/13  8:30 PM      Result Value Range   TSH 3.574  0.350 - 4.500 uIU/mL   Comment: Performed at [facility]     Status: Abnormal   Collection Time    11/17/13 10:05 AM      Result Value Range   Sodium 142  137 - 147 mEq/L   Potassium 3.9  3.7 - 5.3 mEq/L   Comment: DELTA CHECK NOTED     REPEATED TO VERIFY     NO VISIBLE HEMOLYSIS   Chloride 104  96 - 112 mEq/L   CO2 27  19 - 32 mEq/L   Glucose, Bld 155 (*) 70 - 99 mg/dL   BUN 14  6 - 23 mg/dL   Creatinine, Ser 0.89  0.50 - 1.10 mg/dL   Calcium 8.9  8.4 - 10.5 mg/dL   GFR calc non Af Amer 57 (*) >90 mL/min   GFR calc Af Amer 66 (*) >90 mL/min   Comment: (NOTE)     The eGFR has been calculated using the CKD EPI equation.     This calculation has not been validated in all clinical situations.     eGFR's persistently <90 mL/min signify possible Chronic Kidney     Disease.  CK TOTAL AND CKMB     Status: Abnormal   Collection Time    11/18/13  1:06 PM      Result Value Range   Total CK 296 (*) 7 - 177 U/L   CK, MB 5.6 (*) 0.3 - 4.0 ng/mL   Relative Index 1.9  0.0 - 2.5   Comment: Performed at [facility]    Dg Knee Complete 4 Views Right  11/18/2013   CLINICAL DATA:  Medial right knee pain. The clinical indications states status post fall and no known injuries. Chronic right knee pain.  EXAM: RIGHT KNEE - COMPLETE 4+ VIEW  COMPARISON:  DG KNEE COMPLETE 4 VIEWS*R* dated 12/21/2011  FINDINGS: Severe tricompartmental osteoarthritis of the right knee is present. Medial compartment is most severely affected. There is lateral subluxation of the tibia relative to the distal femur. Large effusion is present which is probably degenerative. There is no fracture or aggressive osseous lesion.  IMPRESSION: Severe tricompartmental osteoarthritis with degenerative large effusion. The knee appears similar to the prior exam.   Electronically Signed   By: Dereck Ligas M.D.   On: 11/18/2013 14:35    Review of Systems  Musculoskeletal:       Right knee pain and swelling  All other systems reviewed and are negative.   Blood pressure 134/57, pulse 65, temperature 98.4 F (36.9 C), temperature source Oral, resp. rate 16, SpO2 98.00%. Physical Exam  Constitutional: She appears well-developed and well-nourished.  HENT:  Head: Normocephalic and atraumatic.  Eyes: EOM are normal.  Cardiovascular: Intact distal pulses.   Respiratory:  Effort normal.  Musculoskeletal:  Right knee pain and large effusion Medial and lateral joint line TTP Full rom with pain and crepitance No erythema, warmth, calf tenderness  Neurological: She is alert.  Skin: Skin is warm and dry.  Psychiatric: She has a normal mood and affect. Her behavior is normal.    Assessment/Plan: Right knee tricompartmental osteoarthritis- exacerbation s/p fall 11/16/13 Will aspirate and inject with corticosteroid at bedside to attempt to decrease effusion, inflammation and help pain, wrap with ACE for compression WBAT R LE Follow up with Dr. Tamera Punt in 3 weeks   Procedure:  Risks, benefits and complications were reviewed with the patient. After obtaining informed verbal consent the right knee was sterilely prepped with alcohol.  2 CC lidocaine was introduced in the superior lateral portal site to anesthetize the superficial area using a 25-gague 1 in needle. After a few minutes, and 18 gague 1-1/2 inch needle was introduced in same portal position and 80 cc of clear serosanganuinous fluid was aspirated. We then injected 1 cc Kenalog with 7 cc 1% lidocaine without epinephrine.  Patient tolerated the procedure well and was observed for 5-10 minutes. Knee wrapped with an Ace bandage. Post injection instructions, including signs and symptoms of complications were discussed.   Grier Mitts 11/18/2013, 6:30 PM

## 2013-11-18 NOTE — Progress Notes (Signed)
Clinical Social Work  CSW spoke with [facility] who can accept patient when medically stable. Dtr aware and agreeable to plans and will meet with SNF at 10 am on 1/29 to complete paperwork. CSW will continue to follow and will assist with DC.  Unk LightningHolly Zanaya Baize, LCSW (Coverage for [employer]uzanna Kidd)

## 2013-11-18 NOTE — Progress Notes (Addendum)
Clinical Social Work Department CLINICAL SOCIAL WORK PLACEMENT NOTE 11/18/2013  Patient:  [name],Casey Vasquez  Account Number:  [id number] Admit date:  11/16/2013  Clinical Social Worker:  Unk LightningHOLLY GERBER, LCSW  Date/time:  11/18/2013 11:00 AM  Clinical Social Work is seeking post-discharge placement for this patient at the following level of care:   SKILLED NURSING   (*CSW will update this form in Epic as items are completed)   11/18/2013  Patient/family provided with [facility] of Clinical Social Work's list of facilities offering this level of care within the geographic area requested by the patient (or if unable, by the patient's family).  11/18/2013  Patient/family informed of their freedom to choose among providers that offer the needed level of care, that participate in Medicare, Medicaid or managed care program needed by the patient, have an available bed and are willing to accept the patient.  11/18/2013  Patient/family informed of [facility]' ownership interest in [facility], as well as of the fact that they are under no obligation to receive care at this facility.  PASARR submitted to EDS on existing # PASARR number received from EDS on   FL2 transmitted to all facilities in geographic area requested by pt/family on  11/18/2013 FL2 transmitted to all facilities within larger geographic area on   Patient informed that his/her managed care company has contracts with or will negotiate with  certain facilities, including the following:     Patient/family informed of bed offers received:  11/18/2013 [facility]  Physician recommends and patient chooses bed at    [facility] 11/[facility]/2013 Patient to be transferred to facility by ambulance Casey Vasquez(PTAR)  The following physician request were entered in Epic:   Additional Comments:  Casey Vasquez, MSW,  LCSW Clinical Social Work [phone]

## 2013-11-18 NOTE — Care Management Note (Signed)
   CARE MANAGEMENT NOTE 11/18/2013  Patient:  Casey Vasquez,Casey Vasquez   Account Number:  [id number]  Date Initiated:  11/18/2013  Documentation initiated by:  Alyna Stensland  Subjective/Objective Assessment:   80 yo female admitted s/p. PTA pt from home with nephew and caregiver. PCP: Rudi HeapMOORE, DONALD, MD     Action/Plan:   SNF   Anticipated DC Date:  11/19/2013   Anticipated DC Plan:  SKILLED NURSING FACILITY  In-house referral  Clinical Social Worker      DC Planning Services  CM consult      Choice offered to / List presented to:  Vasquez-4 Adult Children   DME arranged  NA      DME agency  NA     HH arranged  NA      HH agency  NA   Status of service:  Completed, signed off Medicare Important Message given?   (If response is "NO", the following Medicare IM given date fields will be blank) Date Medicare IM given:   Date Additional Medicare IM given:    Discharge Disposition:    Per UR Regulation:  Reviewed for med. necessity/level of care/duration of stay  If discussed at Long Length of Stay Meetings, dates discussed:    Comments:  11/18/13 1206 Roxy Mannsymeeka Kaylor Maiers,MSN,RN [phone] Chart reviewed for utilization of services. Cm spoke with pt's daughter concernng [facility] consult [facility]. Per Adult Daughter Terrial RhodesDonna Givens [phone] pt disposition to SNF. CSW notified. CM to sign off.

## 2013-11-18 NOTE — Care Management Note (Addendum)
Cm spoke with patient's adult daughter Terrial RhodesDonna Givens at [phone] concerning discharge planning. PT recommends SNF.Per pt's daughter, pt's choice is SNF placement. CSW made aware.  Roxy Mannsymeeka Kahlel Peake,MSN,RN [phone]

## 2013-11-18 NOTE — Progress Notes (Signed)
[facility] PROGRESS NOTE   JENAVEE LAGUARDIA ZOX:[id number] DOB: 12/13/1923 DOA: 11/16/2013 PCP: Rudi Heap, MD  Brief narrative: Casey Vasquez is an 80 y.o. female with PMH of osteopenia, dementia, hypothyroidism, and neuropathy was admitted on 11/16/2013 after being brought to the ER after suffering a mechanical fall. Patient had mild rhabdomyolysis and hypokalemia upon initial presentation.  Assessment/Plan: Principal Problem:   Fall at home Fall was likely secondary to generalized weakness from hypokalemia. Patient has a known history of osteoarthritis and given her advanced age, she is deconditioned. Chest x-ray, bilateral hip films, CT of the head and cervical spine were negative for acute fractures. The patient has significant right knee pain, so we'll get 4 views of the right knee to rule out knee fracture. Orthopedic consultation has been requested for consideration of right knee steroid injection. Seen by physical therapy with SNF placement recommended. Active Problems:   HTN (hypertension) Continue amlodipine and Lotensin.   Hypothyroidism Continue Synthroid. TSH WNL at 3.574.   Neuropathy Continue Neurontin.   Osteoarthritis of right knee Obtain knee films given significant pain. Resume Voltaren gel.    Hypokalemia Repleted.   Dementia Remains slightly confused.   Rhabdomyolysis Check CK to ensure resolution. Lipitor on hold  Code Status: Full. Family Communication: Terrial Rhodes [phone] updated by telephone (daughter) Disposition Plan: SNF.   IV access:  Peripheral IV  Medical Consultants:  None.  Other Consultants:  Physical therapy: SNF  Anti-infectives:  None.  HPI/Subjective: Casey Vasquez is having a significant amount of right knee pain. No complaints of pain in other areas. No dyspnea or cough. No fevers.  Objective: Filed Vitals:   11/16/13 1955 11/17/13 0515 11/17/13 1359 11/17/13 2120  BP: 144/56 147/60 133/58 136/57   Pulse: 58 62 75 62  Temp:  98.1 F (36.7 C) 97.3 F (36.3 C) 98.5 F (36.9 C)  TempSrc: Oral Oral Oral Oral  Resp: 16 16 16 15   SpO2: 94% 100% 97% 98%    Intake/Output Summary (Last 24 hours) at 11/18/13 1045 Last data filed at 11/17/13 1954  Gross per 24 hour  Intake    360 ml  Output    250 ml  Net    110 ml    Exam: Gen:  NAD Cardiovascular:  RRR, No M/R/G Respiratory:  Lungs CTAB Gastrointestinal:  Abdomen soft, NT/ND, + BS Extremities:  No C/E/C, right knee pain with minimal movement  Data Reviewed: Basic Metabolic Panel:  Recent Labs Lab 11/16/13 1608 11/17/13 1005  NA 144 142  K 3.0* 3.9  CL 101 104  CO2 29 27  GLUCOSE 99 155*  BUN 14 14  CREATININE 0.95 0.89  CALCIUM 9.5 8.9   GFR The CrCl is unknown because both a height and weight (above a minimum accepted value) are required for this calculation. Liver Function Tests:  Recent Labs Lab 11/16/13 1608  AST 45*  ALT 23  ALKPHOS 94  BILITOT 1.5*  PROT 7.1  ALBUMIN 3.7   CBC:  Recent Labs Lab 11/16/13 1608  WBC 11.8*  NEUTROABS 8.7*  HGB 12.9  HCT 38.3  MCV 89.1  PLT 300   Cardiac Enzymes:  Recent Labs Lab 11/16/13 1608  CKTOTAL 1089*  TROPONINI <0.30   Thyroid function studies  Recent Labs  11/16/13 2030  TSH 3.574   Microbiology Recent Results (from the past 240 hour(s))  URINE CULTURE     Status: None   Collection Time    11/16/13  5:01 PM  Result Value Range Status   Specimen Description URINE, CATHETERIZED   Final   Special Requests NONE   Final   Culture  Setup Time     Final   Value: 11/16/2013 23:11     Performed at [facility]   [place] Count     Final   Value: NO GROWTH     Performed at [facility]   Culture     Final   Value: NO GROWTH     Performed at [facility]   Report Status 11/17/2013 FINAL   Final     Procedures and Diagnostic Studies:  Dg Chest 2 View 11/16/2013  IMPRESSION: Cardiomegaly without acute  disease.   Electronically Signed   By: Drusilla Kannerhomas  Dalessio M.D.   On: 11/16/2013 18:07    Dg Hip Bilateral W/pelvis 11/16/2013 IMPRESSION: No acute finding.  Remote healed left intertrochanteric fracture with fixation hardware in place.   Electronically Signed   By: Drusilla Kannerhomas  Dalessio M.D.   On: 11/16/2013 17:52    Ct Head Wo Contrast 11/16/2013  IMPRESSION: No acute intracranial pathology.   Electronically Signed   By: Elige KoHetal  Patel   On: 11/16/2013 17:28    Ct Cervical Spine Wo Contrast 11/16/2013  IMPRESSION: No acute osseous injury of the cervical spine.   Electronically Signed   By: Elige KoHetal  Patel   On: 11/16/2013 17:30    Scheduled Meds: . amLODipine  5 mg Oral Daily  . benazepril  40 mg Oral Daily  . gabapentin  300 mg Oral BID  . levothyroxine  25 mcg Oral QAC breakfast   Continuous Infusions:   Time spent: 35 minutes with > 50% of time discussing current diagnostic test results, clinical impression and plan of care with the patient's daughter.    LOS: 2 days   Nadir Vasques  [facility] Pager [phone].   *Please note that the hospitalists switch teams on Wednesdays. Please call the flow manager at [phone] if you are having difficulty reaching the hospitalist taking care of this patient as she can update you and provide the most up-to-date pager number of provider caring for the patient. If 8PM-8AM, please contact night-coverage at [email or web address], password [email or web address]  11/18/2013, 10:45 AM

## 2013-11-18 NOTE — Consult Note (Signed)
Agree with above.  F/u in my office in 3 wks for recheck or as needed if asymptomatic.

## 2013-11-19 DIAGNOSIS — M6282 Rhabdomyolysis: Secondary | ICD-10-CM

## 2013-11-19 MED ORDER — ACETAMINOPHEN 325 MG PO TABS: 650.0000 mg | ORAL_TABLET | Freq: Four times a day (QID) | ORAL | Status: AC | PRN

## 2013-11-19 MED ORDER — HYDROCODONE-ACETAMINOPHEN 5-325 MG PO TABS: 1.0000 | ORAL_TABLET | ORAL | Status: DC | PRN

## 2013-11-19 NOTE — Discharge Instructions (Signed)
Arthritis, Nonspecific  Arthritis is pain, redness, warmth, or puffiness (inflammation) of a joint. The joint may be stiff or hurt when you move it. One or more joints may be affected. There are many types of arthritis. Your doctor may not know what type you have right away. The most common cause of arthritis is wear and tear on the joint (osteoarthritis).  HOME CARE   · Only take medicine as told by your doctor.  · Rest the joint as much as possible.  · Raise (elevate) your joint if it is puffy.  · Use crutches if the painful joint is in your leg.  · Drink enough fluids to keep your pee (urine) clear or pale yellow.  · Follow your doctor's diet instructions.  · Use cold packs for very bad joint pain for 10 to 15 minutes every hour. Ask your doctor if it is okay for you to use hot packs.  · Exercise as told by your doctor.  · Take a warm shower if you have stiffness in the morning.  · Move your sore joints throughout the day.  GET HELP RIGHT AWAY IF:   · You have a fever.  · You have very bad joint pain, puffiness, or redness.  · You have many joints that are painful and puffy.  · You are not getting better with treatment.  · You have very bad back pain or leg weakness.  · You cannot control when you poop (bowel movement) or pee (urinate).  · You do not feel better in 24 hours or are getting worse.  · You are having side effects from your medicine.  MAKE SURE YOU:   · Understand these instructions.  · Will watch your condition.  · Will get help right away if you are not doing well or get worse.  Document Released: 01/02/2010 Document Revised: 04/08/2012 Document Reviewed: 01/02/2010  [employer]® Patient Information ©2014 [employer].

## 2013-11-19 NOTE — Progress Notes (Signed)
Patient d/c to SNF via ambulance. Alert,confused, in good spirit.Denies pain. Stable- Hulda Marinonna Ailani Governale RN

## 2013-11-19 NOTE — Progress Notes (Signed)
[facility]  CSW facilitated pt discharge needs including contacting facility, faxing pt discharge information via [facility], discussing with pt daughter via telephone, providing RN phone number to call report, and arranging [facility] scheduled for 2:15 pm as pt daughter wanted to arrive to hospital to be with pt for transport. (Service Request ID#: [id number]).  No further social work needs identified at this time.  CSW signing off.   Loletta SpecterSuzanna Laurette Villescas, MSW, LCSW Clinical Social Work [phone]

## 2013-11-19 NOTE — Discharge Summary (Signed)
Physician Discharge Summary  Casey GlassCatherine C Davie ZOX:[id number] DOB: 09/23/1924 DOA: 11/16/2013  PCP: Rudi HeapMOORE, DONALD, MD  Admit date: 11/16/2013 Discharge date: 11/19/2013  Recommendations for Outpatient Follow-up:  1. Followup with Dr. Ave Filterhandler in his office in 3 weeks for recheck of right knee.  Discharge Diagnoses:  Principal Problem:    Fall at home exacerbating right knee osteoarthritis with gait disturbance Active Problems:    HTN (hypertension)    Hypothyroidism    Neuropathy    Osteoarthritis of right knee    Hypokalemia    Dementia    Fall    Rhabdomyolysis   Discharge Condition: Stable.  Diet recommendation: Low-sodium, heart healthy.  History of present illness:  Casey GlassCatherine C Navedo is an 80 y.o. female with PMH of osteopenia, dementia, hypothyroidism, and neuropathy was admitted on 11/16/2013 after being brought to the ER after suffering a mechanical fall. Patient had mild rhabdomyolysis and hypokalemia upon initial presentation.  Hospital Course by problem:  Principal Problem:  Fall at home exacerbating right knee arthritis with gait disturbance  Fall was likely secondary to generalized weakness from hypokalemia. Patient has a known history of osteoarthritis and given her advanced age, she is deconditioned. Chest x-ray, bilateral hip films, CT of the head and cervical spine were negative for acute fractures. The patient has significant right knee pain, we obtained 4 views of the right knee to rule out knee fracture on 11/18/13, which was negative. Seen by Dr. Ave Filterhandler of orthopedics and was given a right knee steroid injection, with improvement of pain. Seen by physical therapy with SNF placement recommended.  Active Problems:  HTN (hypertension)  Continue amlodipine and Lotensin.  Hypothyroidism  Continue Synthroid. TSH WNL at 3.574.  Neuropathy  Continue Neurontin.  Osteoarthritis of right knee  Knee films negative for fracture. Significant arthritis noted.  Continue Voltaren gel. We'll discharge on hydrocodone as needed for breakthrough pain. Hypokalemia  Repleted.  Dementia  Remains slightly confused.  Rhabdomyolysis  CK levels coming down. Okay to resume Lipitor in 24 hours.   Procedures:  Right knee steroid injection 11/19/13 by Dr. Ave Filterhandler.  Consultations:  Dr. Jones BroomJustin Chandler, Orthopedics  Discharge Exam: Filed Vitals:   11/19/13 0948  BP: 142/60  Pulse: 60  Temp:   Resp:    Filed Vitals:   11/18/13 1500 11/18/13 2106 11/19/13 0620 11/19/13 0948  BP: 134/57 110/59 148/65 142/60  Pulse: 65 75 56 60  Temp: 98.4 F (36.9 C) 98.7 F (37.1 C) 97.4 F (36.3 C)   TempSrc: Oral Oral Oral   Resp: 16 18 16    SpO2: 98% 97% 98%     Gen:  NAD Cardiovascular:  RRR, No M/R/G Respiratory: Lungs CTAB Gastrointestinal: Abdomen soft, NT/ND with normal active bowel sounds. Extremities: No C/E/C   Discharge Instructions  Discharge Orders   Future Orders Complete By Expires   Call MD for:  severe uncontrolled pain  As directed    Diet - low sodium heart healthy  As directed    Discharge instructions  As directed    Comments:     You were cared for by Dr. Hillery Aldohristina Evaluna Utke  (a hospitalist) during your hospital stay. If you have any questions about your discharge medications or the care you received while you were in the hospital after you are discharged, you can call the unit and ask to speak with the hospitalist on call if the hospitalist that took care of you is not available. Once you are discharged, your primary care physician will handle  any further medical issues. Please note that NO REFILLS for any discharge medications will be authorized once you are discharged, as it is imperative that you return to your primary care physician (or establish a relationship with a primary care physician if you do not have one) for your aftercare needs so that they can reassess your need for medications and monitor your lab values.  Any outstanding  tests can be reviewed by your PCP at your follow up visit.  It is also important to review any medicine changes with your PCP.  Please bring these d/c instructions with you to your next visit so your physician can review these changes with you.  If you do not have a primary care physician, you can call [phone] for a physician referral.  It is highly recommended that you obtain a PCP for hospital follow up.   Increase activity slowly  As directed    Walk with assistance  As directed    Walker   As directed        Medication List         acetaminophen 325 MG tablet  Commonly known as:  TYLENOL  Take 2 tablets (650 mg total) by mouth every 6 (six) hours as needed for mild pain (or Fever >/= 101).     amLODipine 5 MG tablet  Commonly known as:  NORVASC  Take 1 tablet (5 mg total) by mouth daily.     atorvastatin 40 MG tablet  Commonly known as:  LIPITOR  Take 40 mg by mouth daily.     benazepril 40 MG tablet  Commonly known as:  LOTENSIN  Take 1 tablet (40 mg total) by mouth daily.     calcium citrate-vitamin D 315-200 MG-UNIT per tablet  Commonly known as:  CITRACAL+D  Take 1 tablet by mouth 2 (two) times daily.     cholecalciferol 1000 UNITS tablet  Commonly known as:  VITAMIN D  Take 1,000 Units by mouth daily.     diclofenac sodium 1 % Gel  Commonly known as:  VOLTAREN  Apply 2 g topically 4 (four) times daily.     gabapentin 300 MG capsule  Commonly known as:  NEURONTIN  Take 1 capsule (300 mg total) by mouth 3 (three) times daily.     HYDROcodone-acetaminophen 5-325 MG per tablet  Commonly known as:  NORCO/VICODIN  Take 1-2 tablets by mouth every 4 (four) hours as needed for moderate pain.     levothyroxine 25 MCG tablet  Commonly known as:  SYNTHROID, LEVOTHROID  Take 1 tablet (25 mcg total) by mouth daily.     traZODone 50 MG tablet  Commonly known as:  DESYREL  Take 1 tablet (50 mg total) by mouth at bedtime as needed for sleep.          The results  of significant diagnostics from this hospitalization (including imaging, microbiology, ancillary and laboratory) are listed below for reference.    Significant Diagnostic Studies: Dg Chest 2 View  11/16/2013   CLINICAL DATA:  Fall.  Shortness of breath.  EXAM: CHEST  2 VIEW  COMPARISON:  CT and Plain film of the chest 08/27/2013.  FINDINGS: There is cardiomegaly without edema. Lungs are clear. No pneumothorax or pleural effusion. No focal bony abnormality.  IMPRESSION: Cardiomegaly without acute disease.   Electronically Signed   By: Drusilla Kanner M.D.   On: 11/16/2013 18:07   Dg Hip Bilateral W/pelvis  11/16/2013   CLINICAL DATA:  Status post fall.  Left hip surgery.  EXAM: BILATERAL HIP WITH PELVIS - 4+ VIEW  COMPARISON:  Single view of the pelvis 02/25/2010.  FINDINGS: Remote healed left intertrochanteric fracture with fixation hardware in place is noted. There is no acute bony or joint abnormality. Mild degenerative change is seen about the hips.  IMPRESSION: No acute finding.  Remote healed left intertrochanteric fracture with fixation hardware in place.   Electronically Signed   By: Drusilla Kanner M.D.   On: 11/16/2013 17:52   Ct Head Wo Contrast  11/16/2013   CLINICAL DATA:  FALL  EXAM: CT HEAD WITHOUT CONTRAST  TECHNIQUE: Contiguous axial images were obtained from the base of the skull through the vertex without intravenous contrast.  COMPARISON:  CT HEAD W/O CM dated 08/28/2013; CT HEAD W/O CM dated 01/07/2013; CT HEAD W/O CM dated 11/20/2010  FINDINGS: There is no evidence of mass effect, midline shift, or extra-axial fluid collections. There is no evidence of a space-occupying lesion or intracranial hemorrhage. There is no evidence of a cortical-based area of acute infarction. There is generalized cerebral atrophy. There is periventricular white matter low attenuation likely secondary to microangiopathy.  The ventricles and sulci are appropriate for the patient's age. The basal cisterns are  patent.  Visualized portions of the orbits are unremarkable. The visualized portions of the paranasal sinuses and mastoid air cells are unremarkable. Cerebrovascular atherosclerotic calcifications are noted.  The osseous structures are unremarkable.  IMPRESSION: No acute intracranial pathology.   Electronically Signed   By: Elige Ko   On: 11/16/2013 17:28   Ct Cervical Spine Wo Contrast  11/16/2013   CLINICAL DATA:  FALL  EXAM: CT CERVICAL SPINE WITHOUT CONTRAST  TECHNIQUE: Multidetector CT imaging of the cervical spine was performed without intravenous contrast. Multiplanar CT image reconstructions were also generated.  COMPARISON:  None.  FINDINGS: The alignment is anatomic. The vertebral body heights are maintained. There is no acute fracture. There is no static listhesis. The prevertebral soft tissues are normal. The intraspinal soft tissues are not fully imaged on this examination due to poor soft tissue contrast, but there is no gross soft tissue abnormality.  There is degenerative disc disease of the cervical spine most significant at C4-5, C5-6 and C6-7. There is mild broad-based disc osteophyte complexes that C4-5, C5-6 and C6-7. There is bilateral foraminal stenosis at C4-5 with bilateral facet arthropathy, left greater than right.  The visualized portions of the lung apices demonstrate no focal abnormality.  IMPRESSION: No acute osseous injury of the cervical spine.   Electronically Signed   By: Elige Ko   On: 11/16/2013 17:30   Dg Knee Complete 4 Views Right  11/18/2013   CLINICAL DATA:  Medial right knee pain. The clinical indications states status post fall and no known injuries. Chronic right knee pain.  EXAM: RIGHT KNEE - COMPLETE 4+ VIEW  COMPARISON:  DG KNEE COMPLETE 4 VIEWS*R* dated 12/21/2011  FINDINGS: Severe tricompartmental osteoarthritis of the right knee is present. Medial compartment is most severely affected. There is lateral subluxation of the tibia relative to the distal  femur. Large effusion is present which is probably degenerative. There is no fracture or aggressive osseous lesion.  IMPRESSION: Severe tricompartmental osteoarthritis with degenerative large effusion. The knee appears similar to the prior exam.   Electronically Signed   By: Andreas Newport M.D.   On: 11/18/2013 14:35    Labs:  Basic Metabolic Panel:  Recent Labs Lab 11/16/13 1608 11/17/13 1005  NA 144 142  K  3.0* 3.9  CL 101 104  CO2 29 27  GLUCOSE 99 155*  BUN 14 14  CREATININE 0.95 0.89  CALCIUM 9.5 8.9   GFR The CrCl is unknown because both a height and weight (above a minimum accepted value) are required for this calculation. Liver Function Tests:  Recent Labs Lab 11/16/13 1608  AST 45*  ALT 23  ALKPHOS 94  BILITOT 1.5*  PROT 7.1  ALBUMIN 3.7   CBC:  Recent Labs Lab 11/16/13 1608  WBC 11.8*  NEUTROABS 8.7*  HGB 12.9  HCT 38.3  MCV 89.1  PLT 300   Cardiac Enzymes:  Recent Labs Lab 11/16/13 1608 11/18/13 1306  CKTOTAL 1089* 296*  CKMB  --  5.6*  TROPONINI <0.30  --    Thyroid function studies  Recent Labs  11/16/13 2030  TSH 3.574   Microbiology Recent Results (from the past 240 hour(s))  URINE CULTURE     Status: None   Collection Time    11/16/13  5:01 PM      Result Value Range Status   Specimen Description URINE, CATHETERIZED   Final   Special Requests NONE   Final   Culture  Setup Time     Final   Value: 11/16/2013 23:11     Performed at [facility] Count     Final   Value: NO GROWTH     Performed at [facility]   Culture     Final   Value: NO GROWTH     Performed at [facility]   Report Status 11/17/2013 FINAL   Final    Time coordinating discharge: 35 minutes.  Signed:  Law Corsino  Pager [phone] [facility] 11/19/2013, 11:05 AM

## 2013-11-19 NOTE — Progress Notes (Signed)
Patient's d/c instructions given to her daughter, verbalized understanding.- Hulda Marinonna Masiyah Jorstad RN

## 2013-11-19 NOTE — Progress Notes (Signed)
[facility] and gave report to Graybar ElectricBethel Ayldabo.Hulda Marin- Riyan Haile RN

## 2014-01-09 ENCOUNTER — Emergency Department (HOSPITAL_COMMUNITY): Payer: Medicare Other

## 2014-01-09 ENCOUNTER — Emergency Department (HOSPITAL_COMMUNITY)
Admission: EM | Admit: 2014-01-09 | Discharge: 2014-01-09 | Disposition: A | Discharge status: Home / Self Care | Payer: Medicare Other | Attending: Emergency Medicine | Admitting: Emergency Medicine

## 2014-01-09 ENCOUNTER — Encounter (HOSPITAL_COMMUNITY): Payer: Self-pay | Admitting: Emergency Medicine

## 2014-01-09 DIAGNOSIS — Z880 Allergy status to penicillin: Secondary | ICD-10-CM | POA: Insufficient documentation

## 2014-01-09 DIAGNOSIS — Y9210 Unspecified residential institution as the place of occurrence of the external cause: Secondary | ICD-10-CM | POA: Insufficient documentation

## 2014-01-09 DIAGNOSIS — W19XXXA Unspecified fall, initial encounter: Secondary | ICD-10-CM | POA: Insufficient documentation

## 2014-01-09 DIAGNOSIS — G589 Mononeuropathy, unspecified: Secondary | ICD-10-CM | POA: Insufficient documentation

## 2014-01-09 DIAGNOSIS — N390 Urinary tract infection, site not specified: Secondary | ICD-10-CM

## 2014-01-09 DIAGNOSIS — M25579 Pain in unspecified ankle and joints of unspecified foot: Secondary | ICD-10-CM | POA: Insufficient documentation

## 2014-01-09 DIAGNOSIS — Y9389 Activity, other specified: Secondary | ICD-10-CM | POA: Insufficient documentation

## 2014-01-09 DIAGNOSIS — R609 Edema, unspecified: Secondary | ICD-10-CM | POA: Insufficient documentation

## 2014-01-09 DIAGNOSIS — E039 Hypothyroidism, unspecified: Secondary | ICD-10-CM | POA: Insufficient documentation

## 2014-01-09 DIAGNOSIS — Z79899 Other long term (current) drug therapy: Secondary | ICD-10-CM | POA: Insufficient documentation

## 2014-01-09 DIAGNOSIS — G8929 Other chronic pain: Secondary | ICD-10-CM | POA: Insufficient documentation

## 2014-01-09 DIAGNOSIS — I10 Essential (primary) hypertension: Secondary | ICD-10-CM | POA: Insufficient documentation

## 2014-01-09 DIAGNOSIS — S32000A Wedge compression fracture of unspecified lumbar vertebra, initial encounter for closed fracture: Secondary | ICD-10-CM

## 2014-01-09 DIAGNOSIS — S32009A Unspecified fracture of unspecified lumbar vertebra, initial encounter for closed fracture: Secondary | ICD-10-CM | POA: Insufficient documentation

## 2014-01-09 DIAGNOSIS — M899 Disorder of bone, unspecified: Secondary | ICD-10-CM | POA: Insufficient documentation

## 2014-01-09 DIAGNOSIS — M949 Disorder of cartilage, unspecified: Secondary | ICD-10-CM

## 2014-01-09 DIAGNOSIS — F0390 Unspecified dementia without behavioral disturbance: Secondary | ICD-10-CM | POA: Insufficient documentation

## 2014-01-09 LAB — URINE MICROSCOPIC-ADD ON

## 2014-01-09 LAB — URINALYSIS, ROUTINE W REFLEX MICROSCOPIC
BILIRUBIN URINE: NEGATIVE
GLUCOSE, UA: NEGATIVE mg/dL
HGB URINE DIPSTICK: NEGATIVE
Ketones, ur: NEGATIVE mg/dL
Nitrite: POSITIVE — AB
PROTEIN: NEGATIVE mg/dL
Specific Gravity, Urine: 1.011 (ref 1.005–1.030)
Urobilinogen, UA: 0.2 mg/dL (ref 0.0–1.0)
pH: 7 (ref 5.0–8.0)

## 2014-01-09 LAB — I-STAT CHEM 8, ED
BUN: 24 mg/dL — AB (ref 6–23)
CREATININE: 1.2 mg/dL — AB (ref 0.50–1.10)
Calcium, Ion: 0.85 mmol/L — ABNORMAL LOW (ref 1.13–1.30)
Chloride: 111 mEq/L (ref 96–112)
GLUCOSE: 109 mg/dL — AB (ref 70–99)
HCT: 38 % (ref 36.0–46.0)
HEMOGLOBIN: 12.9 g/dL (ref 12.0–15.0)
Potassium: 3.4 mEq/L — ABNORMAL LOW (ref 3.7–5.3)
SODIUM: 137 meq/L (ref 137–147)
TCO2: 13 mmol/L (ref 0–100)

## 2014-01-09 MED ORDER — CIPROFLOXACIN HCL 500 MG PO TABS
500.0000 mg | ORAL_TABLET | Freq: Once | ORAL | Status: AC
  Administered 2014-01-09: 500 mg via ORAL
  Filled 2014-01-09: qty 1

## 2014-01-09 MED ORDER — AMLODIPINE BESYLATE 5 MG PO TABS
5.0000 mg | ORAL_TABLET | Freq: Once | ORAL | Status: AC
  Administered 2014-01-09: 5 mg via ORAL
  Filled 2014-01-09: qty 1

## 2014-01-09 MED ORDER — ACETAMINOPHEN 325 MG PO TABS
650.0000 mg | ORAL_TABLET | Freq: Once | ORAL | Status: AC
  Administered 2014-01-09: 650 mg via ORAL
  Filled 2014-01-09: qty 2

## 2014-01-09 MED ORDER — ACETAMINOPHEN 500 MG PO TABS: 500.0000 mg | ORAL_TABLET | Freq: Four times a day (QID) | ORAL | Status: DC | PRN

## 2014-01-09 MED ORDER — CIPROFLOXACIN HCL 500 MG PO TABS: 500.0000 mg | ORAL_TABLET | Freq: Two times a day (BID) | ORAL | Status: DC

## 2014-01-09 NOTE — ED Notes (Signed)
Pt alert and oriented to baseline, hx of dementia. Respirations even and unlabored, bilateral symmetrical rise and fall of chest. Skin warm and dry. In no acute distress. Denies needs.

## 2014-01-09 NOTE — ED Notes (Signed)
rn called [facility], gave report. ptar called.

## 2014-01-09 NOTE — ED Notes (Signed)
Bed: [id number] Expected date:  Expected time:  Means of arrival:  Comments: EMS 80yo F found on floor, hx of dementia

## 2014-01-09 NOTE — ED Notes (Signed)
Waiting for PTAR 

## 2014-01-09 NOTE — ED Notes (Signed)
Per facility pt was seen at 2am this morning sitting on edge of bed and was found around 5am on the floor on her right side. Spine and neck palpated by EMS no neuro deficits. Pt c/o right flank pain with right upper ankle pain.Pt does not remember how she ended up on the floor. CBG 112 and has mild dementia. Pt is currently at baseline.

## 2014-01-09 NOTE — ED Provider Notes (Addendum)
CSN: [id number]     Arrival date & time 01/09/14  0630 History   First MD Initiated Contact with Patient 01/09/14 [id number]     Chief Complaint  Patient presents with  . Fall     (Consider location/radiation/quality/duration/timing/severity/associated sxs/prior Treatment) HPI Comments: Per staff pt at her baseline mental status  Patient is a 80 y.o. female presenting with fall. The history is provided by the patient, the EMS personnel and the nursing home. The history is limited by the absence of a caregiver.  Fall This is a new (pt seen by nursing home staff at 2 sitting on the side of her bed and then when they went by around 5 she was in the floor on her right side) problem. The current episode started 3 to 5 hours ago. The problem occurs constantly. The problem has not changed since onset.Associated symptoms comments: C/o of pain all over but more localized in the back and bilateral ankles. Nothing aggravates the symptoms. Nothing relieves the symptoms. She has tried nothing for the symptoms. The treatment provided no relief.    Past Medical History  Diagnosis Date  . HTN (hypertension)   . Dementia   . Hypothyroid   . Neuropathy   . UTI (urinary tract infection)   . Chronic pain   . Osteopenia   . Insomnia    Past Surgical History  Procedure Laterality Date  . Appendectomy    . Knee surgery    . Total hip arthroplasty     Family History  Problem Relation Age of Onset  . Hypertension    . Alzheimer's disease Mother    History  Substance Use Topics  . Smoking status: Never Smoker   . Smokeless tobacco: Never Used  . Alcohol Use: No   OB History   Grav Para Term Preterm Abortions TAB SAB Ect Mult Living                 Review of Systems  Unable to perform ROS     Allergies  Penicillins  Home Medications   Current Outpatient Rx  Name  Route  Sig  Dispense  Refill  . acetaminophen (TYLENOL) 325 MG tablet   Oral   Take 2 tablets (650 mg total) by mouth every  6 (six) hours as needed for mild pain (or Fever >/= 101).         Marland Kitchen amLODipine (NORVASC) 5 MG tablet   Oral   Take 1 tablet (5 mg total) by mouth daily.   30 tablet   11   . atorvastatin (LIPITOR) 40 MG tablet   Oral   Take 40 mg by mouth daily.         . benazepril (LOTENSIN) 40 MG tablet   Oral   Take 1 tablet (40 mg total) by mouth daily.   30 tablet   5   . calcium citrate-vitamin D (CITRACAL+D) 315-200 MG-UNIT per tablet   Oral   Take 1 tablet by mouth 2 (two) times daily.          . cholecalciferol (VITAMIN D) 1000 UNITS tablet   Oral   Take 1,000 Units by mouth daily.         . diclofenac sodium (VOLTAREN) 1 % GEL   Topical   Apply 2 g topically 4 (four) times daily.   600 g   1   . gabapentin (NEURONTIN) 300 MG capsule   Oral   Take 1 capsule (300 mg total) by mouth 3 (  three) times daily.   90 capsule   5   . levothyroxine (SYNTHROID, LEVOTHROID) 25 MCG tablet   Oral   Take 1 tablet (25 mcg total) by mouth daily.   30 tablet   9   . traZODone (DESYREL) 50 MG tablet   Oral   Take 1 tablet (50 mg total) by mouth at bedtime as needed for sleep.   30 tablet   5    BP 184/63  Pulse 51  Temp(Src) 98 F (36.7 C) (Oral)  Resp 19  SpO2 99% Physical Exam  Nursing note and vitals reviewed. Constitutional: She is oriented to person, place, and time. She appears well-developed and well-nourished. No distress.  HENT:  Head: Normocephalic and atraumatic.  Mouth/Throat: Oropharynx is clear and moist.  No visible head trauma.  Palpation of the head without pain  Eyes: Conjunctivae and EOM are normal. Pupils are equal, round, and reactive to light.  Neck: Normal range of motion. Neck supple.  Cardiovascular: Normal rate, regular rhythm and intact distal pulses.   No murmur heard. Pulmonary/Chest: Effort normal and breath sounds normal. No respiratory distress. She has no wheezes. She has no rales.  Abdominal: Soft. She exhibits no distension. There  is no tenderness. There is no rebound and no guarding.  Musculoskeletal: Normal range of motion. She exhibits edema.       Right hip: Normal.       Left hip: Normal.       Right knee: Normal.       Left knee: Normal.       Right ankle: She exhibits normal range of motion, no swelling and no ecchymosis. Tenderness. Lateral malleolus and medial malleolus tenderness found.       Left ankle: She exhibits normal range of motion, no swelling and no ecchymosis. Tenderness. Lateral malleolus and medial malleolus tenderness found.       Cervical back: Normal.       Thoracic back: She exhibits bony tenderness.       Lumbar back: She exhibits bony tenderness.       Back:  1 pitting edema bilaterally at the ankle  Neurological: She is alert and oriented to person, place, and time.  Skin: Skin is warm and dry. No rash noted. No erythema.  Psychiatric: She has a normal mood and affect. Her behavior is normal.    ED Course  Procedures (including critical care time) Labs Review Labs Reviewed  URINALYSIS, ROUTINE W REFLEX MICROSCOPIC - Abnormal; Notable for the following:    APPearance CLOUDY (*)    Nitrite POSITIVE (*)    Leukocytes, UA LARGE (*)    All other components within normal limits  URINE MICROSCOPIC-ADD ON - Abnormal; Notable for the following:    Bacteria, UA MANY (*)    All other components within normal limits  I-STAT CHEM 8, ED - Abnormal; Notable for the following:    Potassium 3.4 (*)    BUN 24 (*)    Creatinine, Ser 1.20 (*)    Glucose, Bld 109 (*)    Calcium, Ion 0.85 (*)    All other components within normal limits   Imaging Review Dg Thoracic Spine 2 View  01/09/2014   CLINICAL DATA:  Back pain after fall.  EXAM: THORACIC SPINE - 2 VIEW  COMPARISON:  None.  FINDINGS: No fracture or spondylolisthesis is noted. Mild anterior osteophyte formation is noted in the lower thoracic spine. Mild dextroscoliosis of thoracic spine is noted.  IMPRESSION: Mild degenerative changes  are  noted in the lower thoracic spine. No acute abnormality seen.   Electronically Signed   By: Roque LiasJames  Green M.D.   On: 01/09/2014 08:35   Dg Lumbar Spine Complete  01/09/2014   CLINICAL DATA:  Fall  EXAM: LUMBAR SPINE - COMPLETE 4+ VIEW  COMPARISON:  None.  FINDINGS: There are 5 nonrib bearing lumbar-type vertebral bodies. There is an age-indeterminate L4 vertebral body compression fracture with approximately 50% anterior height loss. The alignment is anatomic. There is no spondylolysis. There is no acute fracture or static listhesis. There is mild degenerative disc disease throughout the lumbar spine most severe at L5-S1. There is bilateral facet arthropathy at L4-5 and L5-S1 and to a lesser degree L3-4.  The SI joints are unremarkable.  IMPRESSION: Age-indeterminate L4 anterior vertebral body compression fracture with approximately 50% height loss.   Electronically Signed   By: Elige KoHetal  Patel   On: 01/09/2014 08:35   Dg Ankle Complete Left  01/09/2014   CLINICAL DATA:  Left ankle pain  EXAM: LEFT ANKLE COMPLETE - 3+ VIEW  COMPARISON:  None.  FINDINGS: Left ankle demonstrates no fracture or dislocation. The ankle mortise is intact. There is a plantar calcaneal spur. There is peripheral vascular atherosclerotic disease. There is generalized osteopenia. The soft tissues are normal.  IMPRESSION: No acute osseous injury of the left ankle.   Electronically Signed   By: Elige KoHetal  Patel   On: 01/09/2014 08:39   Dg Ankle Complete Right  01/09/2014   CLINICAL DATA:  Fall  EXAM: RIGHT ANKLE - COMPLETE 3+ VIEW  COMPARISON:  None.  FINDINGS: There is cortical irregularity along the dorsal aspect of the navicular on the lateral view concerning for a nondisplaced fracture. The ankle mortise is intact. There is a plantar calcaneal spur. There generalized osteopenia. There is generalized soft tissue swelling around the right ankle.  IMPRESSION: Cortical irregularity along the dorsal aspect of the navicular on the lateral view  concerning for a nondisplaced fracture. Correlate with point tenderness.   Electronically Signed   By: Elige KoHetal  Patel   On: 01/09/2014 08:38     EKG Interpretation   Date/Time:  Saturday January 09 2014 06:36:37 EDT Ventricular Rate:  56 PR Interval:  150 QRS Duration: 96 QT Interval:  456 QTC Calculation: 440 R Axis:   27 Text Interpretation:  Sinus rhythm Borderline ST elevation, lateral leads  When compared with ECG of 11/16/2013, No significant change was found  Confirmed by Indiana University Health West HospitalGLICK  MD, DAVID ([id number]) on 01/09/2014 6:43:51 AM      MDM   Final diagnoses:  Fall  Lumbar compression fracture  UTI (lower urinary tract infection)    Pt with most likely a mechanical fall that the nursing home last night.  No anticoagulation.  Pt seen sitting on the side of her bed and when checked 2 hours later pt on the floor on her right side.  She c/o of pain all over but most pronounced in the the bilateral ankles although no obvious signs of trauma (swelling, ecchymosis) and full ROM of both joints.  No knee/hip pain bilaterally and able to range without difficulty.  No head or neck injury or c/o of pain.  Pt also has upper midline lumbar and lower thoracic tenderness.  Pt is awake and alert but can't recall the event.  She has hx of advanced dementia and pt also smells of urine.  Will check UA to ensure no UTIs as well.    8:32 AM Imaging without acute  findings.  Pt found to have UTI today and will treat with abx.  Plain films with indeterminate L4 50% compression fx and possible right nondisplaced navicular fx.  On re-exam pt does have some L4 tenderness and will treat with pain control.  No pain over the right navicular fracture.  Gwyneth Sprout, MD 01/09/14 [id number]  Gwyneth Sprout, MD 01/09/14 [phone]

## 2014-03-03 ENCOUNTER — Encounter (HOSPITAL_COMMUNITY): Payer: Self-pay | Admitting: Emergency Medicine

## 2014-03-03 ENCOUNTER — Emergency Department (HOSPITAL_COMMUNITY)
Admission: EM | Admit: 2014-03-03 | Discharge: 2014-03-03 | Disposition: A | Discharge status: Home / Self Care | Payer: Medicare Other | Attending: Emergency Medicine | Admitting: Emergency Medicine

## 2014-03-03 ENCOUNTER — Emergency Department (HOSPITAL_COMMUNITY): Payer: Medicare Other

## 2014-03-03 DIAGNOSIS — M79609 Pain in unspecified limb: Secondary | ICD-10-CM

## 2014-03-03 DIAGNOSIS — M899 Disorder of bone, unspecified: Secondary | ICD-10-CM | POA: Insufficient documentation

## 2014-03-03 DIAGNOSIS — G8929 Other chronic pain: Secondary | ICD-10-CM | POA: Insufficient documentation

## 2014-03-03 DIAGNOSIS — I10 Essential (primary) hypertension: Secondary | ICD-10-CM | POA: Insufficient documentation

## 2014-03-03 DIAGNOSIS — M949 Disorder of cartilage, unspecified: Secondary | ICD-10-CM

## 2014-03-03 DIAGNOSIS — G4700 Insomnia, unspecified: Secondary | ICD-10-CM | POA: Insufficient documentation

## 2014-03-03 DIAGNOSIS — R600 Localized edema: Secondary | ICD-10-CM

## 2014-03-03 DIAGNOSIS — Z87440 Personal history of urinary (tract) infections: Secondary | ICD-10-CM | POA: Insufficient documentation

## 2014-03-03 DIAGNOSIS — R001 Bradycardia, unspecified: Secondary | ICD-10-CM

## 2014-03-03 DIAGNOSIS — G589 Mononeuropathy, unspecified: Secondary | ICD-10-CM | POA: Insufficient documentation

## 2014-03-03 DIAGNOSIS — Z79899 Other long term (current) drug therapy: Secondary | ICD-10-CM | POA: Insufficient documentation

## 2014-03-03 DIAGNOSIS — M1711 Unilateral primary osteoarthritis, right knee: Secondary | ICD-10-CM

## 2014-03-03 DIAGNOSIS — Z791 Long term (current) use of non-steroidal anti-inflammatories (NSAID): Secondary | ICD-10-CM | POA: Insufficient documentation

## 2014-03-03 DIAGNOSIS — E039 Hypothyroidism, unspecified: Secondary | ICD-10-CM | POA: Insufficient documentation

## 2014-03-03 DIAGNOSIS — W19XXXA Unspecified fall, initial encounter: Secondary | ICD-10-CM

## 2014-03-03 DIAGNOSIS — F0390 Unspecified dementia without behavioral disturbance: Secondary | ICD-10-CM | POA: Insufficient documentation

## 2014-03-03 DIAGNOSIS — R609 Edema, unspecified: Secondary | ICD-10-CM

## 2014-03-03 DIAGNOSIS — Z880 Allergy status to penicillin: Secondary | ICD-10-CM | POA: Insufficient documentation

## 2014-03-03 DIAGNOSIS — I4891 Unspecified atrial fibrillation: Secondary | ICD-10-CM

## 2014-03-03 LAB — CBC WITH DIFFERENTIAL/PLATELET
Basophils Absolute: 0.1 10*3/uL (ref 0.0–0.1)
Basophils Relative: 1 % (ref 0–1)
Eosinophils Absolute: 0.4 10*3/uL (ref 0.0–0.7)
Eosinophils Relative: 5 % (ref 0–5)
HCT: 35.9 % — ABNORMAL LOW (ref 36.0–46.0)
HEMOGLOBIN: 11.4 g/dL — AB (ref 12.0–15.0)
LYMPHS ABS: 2.1 10*3/uL (ref 0.7–4.0)
LYMPHS PCT: 25 % (ref 12–46)
MCH: 29.5 pg (ref 26.0–34.0)
MCHC: 31.8 g/dL (ref 30.0–36.0)
MCV: 93 fL (ref 78.0–100.0)
MONOS PCT: 7 % (ref 3–12)
Monocytes Absolute: 0.6 10*3/uL (ref 0.1–1.0)
NEUTROS PCT: 63 % (ref 43–77)
Neutro Abs: 5.2 10*3/uL (ref 1.7–7.7)
Platelets: 298 10*3/uL (ref 150–400)
RBC: 3.86 MIL/uL — AB (ref 3.87–5.11)
RDW: 14.4 % (ref 11.5–15.5)
WBC: 8.3 10*3/uL (ref 4.0–10.5)

## 2014-03-03 LAB — BASIC METABOLIC PANEL
BUN: 20 mg/dL (ref 6–23)
CHLORIDE: 103 meq/L (ref 96–112)
CO2: 33 meq/L — AB (ref 19–32)
Calcium: 9.3 mg/dL (ref 8.4–10.5)
Creatinine, Ser: 0.94 mg/dL (ref 0.50–1.10)
GFR calc Af Amer: 61 mL/min — ABNORMAL LOW (ref >90)
GFR calc non Af Amer: 53 mL/min — ABNORMAL LOW (ref >90)
GLUCOSE: 135 mg/dL — AB (ref 70–99)
POTASSIUM: 3.7 meq/L (ref 3.7–5.3)
SODIUM: 145 meq/L (ref 137–147)

## 2014-03-03 LAB — I-STAT TROPONIN, ED: Troponin i, poc: 0 ng/mL (ref 0.00–0.08)

## 2014-03-03 MED ORDER — FUROSEMIDE 10 MG/ML IJ SOLN
40.0000 mg | Freq: Once | INTRAMUSCULAR | Status: AC
  Administered 2014-03-03: 40 mg via INTRAVENOUS
  Filled 2014-03-03: qty 4

## 2014-03-03 NOTE — ED Notes (Signed)
Doppler at bedside.

## 2014-03-03 NOTE — ED Notes (Signed)
Pt incontinent large amount urine, requiring and entire linen change.  Pt cleaned/changed at this time.  Call bell in reach.  NAD.

## 2014-03-03 NOTE — ED Notes (Signed)
Bed: [id number] Expected date:  Expected time:  Means of arrival:  Comments: EMS l.e. edema

## 2014-03-03 NOTE — ED Notes (Signed)
Pt assisted to void on bedpan, brief changed and pt repositioned.  Pt given lunch.  Pt denies further needs/complaints at this time.  NAD.

## 2014-03-03 NOTE — ED Notes (Signed)
Pt's daughter here sts will provide safe transport for pt back to facility.  [facility] called to cancel.

## 2014-03-03 NOTE — ED Notes (Signed)
Pt again incontinent large amount urine, assisted to clean/change and reposition for comfort.  NAD.

## 2014-03-03 NOTE — ED Notes (Signed)
Per EMS. Pt from [facility] nursing [facility]. Pt complains of worsening edema in legs over past several days, on lasix at facility. Denies sob or chest pain. Pt has bilateral pitting edema to lower legs, pedal pulses strong.

## 2014-03-03 NOTE — ED Provider Notes (Addendum)
CSN: [id number]     Arrival date & time 03/03/14  1038 History   First MD Initiated Contact with Patient 03/03/14 1043     Chief Complaint  Patient presents with  . Leg Swelling     (Consider location/radiation/quality/duration/timing/severity/associated sxs/prior Treatment) HPI Comments: Patient presents with leg swelling. She has a history of pedal edema and also dementia. [facility] He reported that she's had worsening edema in her legs over last 2-3 days. She's denied any shortness of breath or chest pain however the history is limited due to her dementia. She doesn't know how long the symptoms have been going on. There is no reported history of fevers. She is using Lasix 20 mg once a day at the nursing facility. There is no known history of DVTs. She is complaining of bilateral leg pain as well.   Past Medical History  Diagnosis Date  . HTN (hypertension)   . Dementia   . Hypothyroid   . Neuropathy   . UTI (urinary tract infection)   . Chronic pain   . Osteopenia   . Insomnia    Past Surgical History  Procedure Laterality Date  . Appendectomy    . Knee surgery    . Total hip arthroplasty     Family History  Problem Relation Age of Onset  . Hypertension    . Alzheimer's disease Mother    History  Substance Use Topics  . Smoking status: Never Smoker   . Smokeless tobacco: Never Used  . Alcohol Use: No   OB History   Grav Para Term Preterm Abortions TAB SAB Ect Mult Living                 Review of Systems  Unable to perform ROS: Dementia      Allergies  Penicillins  Home Medications   Prior to Admission medications   Medication Sig Start Date End Date Taking? Authorizing Provider  acetaminophen (TYLENOL) 325 MG tablet Take 2 tablets (650 mg total) by mouth every 6 (six) hours as needed for mild pain (or Fever >/= 101). 11/19/13  Yes Christina P Rama, MD  amLODipine (NORVASC) 5 MG tablet Take 5 mg by mouth  daily.   Yes Historical Provider, MD  benazepril (LOTENSIN) 40 MG tablet Take 1 tablet (40 mg total) by mouth daily. 01/12/13  Yes Mary-Margaret Daphine DeutscherMartin, FNP  calcium citrate-vitamin D (CITRACAL+D) 315-200 MG-UNIT per tablet Take 1 tablet by mouth 2 (two) times daily.    Yes Historical Provider, MD  cholecalciferol (VITAMIN D) 1000 UNITS tablet Take 1,000 Units by mouth daily.   Yes Historical Provider, MD  diclofenac sodium (VOLTAREN) 1 % GEL Apply 2 g topically 4 (four) times daily. 08/31/13  Yes Mary-Margaret Daphine DeutscherMartin, FNP  furosemide (LASIX) 20 MG tablet Take 20 mg by mouth daily.   Yes Historical Provider, MD  gabapentin (NEURONTIN) 300 MG capsule Take 1 capsule (300 mg total) by mouth 3 (three) times daily. 08/31/13  Yes Mary-Margaret Daphine DeutscherMartin, FNP  levothyroxine (SYNTHROID, LEVOTHROID) 25 MCG tablet Take 1 tablet (25 mcg total) by mouth daily. 11/03/13  Yes Mary-Margaret Daphine DeutscherMartin, FNP  traZODone (DESYREL) 50 MG tablet Take 1 tablet (50 mg total) by mouth at bedtime as needed for sleep. 08/31/13   Mary-Margaret Daphine DeutscherMartin, FNP   BP 151/57  Pulse 62  Temp(Src) 97.8 F (36.6 C) (Oral)  Resp 11  SpO2 98% Physical Exam  Constitutional: She is oriented to person, place, and time. She  appears well-developed and well-nourished.  HENT:  Head: Normocephalic and atraumatic.  Eyes: Pupils are equal, round, and reactive to light.  Neck: Normal range of motion. Neck supple.  Cardiovascular: Normal rate, regular rhythm and normal heart sounds.   Pulmonary/Chest: Effort normal and breath sounds normal. No respiratory distress. She has no wheezes. She has no rales. She exhibits no tenderness.  Abdominal: Soft. Bowel sounds are normal. There is no tenderness. There is no rebound and no guarding.  Musculoskeletal: Normal range of motion. She exhibits edema.  Bilateral pitting edema to the knees. She has some mild erythema of the liver to raise bilaterally. She has some skin changes consistent with likely chronic  edema. Pedal pulses are intact and symmetric.  Lymphadenopathy:    She has no cervical adenopathy.  Neurological: She is alert and oriented to person, place, and time.  Skin: Skin is warm and dry. No rash noted.  Psychiatric: She has a normal mood and affect.    ED Course  Procedures (including critical care time) Labs Review Results for orders placed during the hospital encounter of 03/03/14  CBC WITH DIFFERENTIAL      Result Value Ref Range   WBC 8.3  4.0 - 10.5 K/uL   RBC 3.86 (*) 3.87 - 5.11 MIL/uL   Hemoglobin 11.4 (*) 12.0 - 15.0 g/dL   HCT 16.1 (*) 09.6 - 04.5 %   MCV 93.0  78.0 - 100.0 fL   MCH 29.5  26.0 - 34.0 pg   MCHC 31.8  30.0 - 36.0 g/dL   RDW 40.9  81.1 - 91.4 %   Platelets 298  150 - 400 K/uL   Neutrophils Relative % 63  43 - 77 %   Neutro Abs 5.2  1.7 - 7.7 K/uL   Lymphocytes Relative 25  12 - 46 %   Lymphs Abs 2.1  0.7 - 4.0 K/uL   Monocytes Relative 7  3 - 12 %   Monocytes Absolute 0.6  0.1 - 1.0 K/uL   Eosinophils Relative 5  0 - 5 %   Eosinophils Absolute 0.4  0.0 - 0.7 K/uL   Basophils Relative 1  0 - 1 %   Basophils Absolute 0.1  0.0 - 0.1 K/uL  BASIC METABOLIC PANEL      Result Value Ref Range   Sodium 145  137 - 147 mEq/L   Potassium 3.7  3.7 - 5.3 mEq/L   Chloride 103  96 - 112 mEq/L   CO2 33 (*) 19 - 32 mEq/L   Glucose, Bld 135 (*) 70 - 99 mg/dL   BUN 20  6 - 23 mg/dL   Creatinine, Ser 7.82  0.50 - 1.10 mg/dL   Calcium 9.3  8.4 - 95.6 mg/dL   GFR calc non Af Amer 53 (*) >90 mL/min   GFR calc Af Amer 61 (*) >90 mL/min  I-STAT TROPOININ, ED      Result Value Ref Range   Troponin i, poc 0.00  0.00 - 0.08 ng/mL   Comment 3            No results found.    Imaging Review Dg Chest 2 View  03/03/2014   CLINICAL DATA:  Shortness of breath with left lower extremity swelling  EXAM: CHEST  2 VIEW  COMPARISON:  PA and lateral chest x-ray dated November 16, 2013  FINDINGS: The lungs are adequately inflated. There is no focal infiltrate. There is no  pleural effusion or pneumothorax. The cardiopericardial silhouette is  top-normal in size. The pulmonary vascularity is not engorged. The mediastinum is normal in width. The observed portions of the bony thorax appear normal.  IMPRESSION: There is no evidence of pneumonia nor CHF nor other active cardiopulmonary disease.   Electronically Signed   By: David  SwazilandJordan   On: 03/03/2014 12:57     EKG Interpretation   Date/Time:  Wednesday Mar 03 2014 11:13:35 EDT Ventricular Rate:  59 PR Interval:  164 QRS Duration: 95 QT Interval:  439 QTC Calculation: 435 R Axis:   36 Text Interpretation:  Sinus rhythm Low voltage, precordial leads since  last tracing no significant change Confirmed by Johnrobert Foti  MD, Deriana Vanderhoef ([id number])  on 03/03/2014 2:41:35 PM      MDM   Final diagnoses:  Pedal edema    Patient presents with bilateral pedal edema. Her lungs are clear with no signs of pulmonary edema. Her renal function is normal. She has no ischemic findings were evidence of cellulitis to her extremities. She's afebrile with no elevation in her white count. She was given an extra dose of Lasix here in the ED and will followup with her primary care physician. She has good range of motion in her lower she radiates with no pain to her hips. She has pain to both knees and has had a history of degenerative joint disease to her knees.    Rolan BuccoMelanie Justise Ehmann, MD 03/03/14 1400  Rolan BuccoMelanie Lakin Rhine, MD 03/03/14 [phone]

## 2014-03-03 NOTE — ED Notes (Signed)
Pt again incontinent copious amount urine requiring entire linen change.  Pt also assisted to have BM on bedpan at this time.  Pt denies further needs/complaints at this time.  Call bell in reach.  NAD.

## 2014-03-03 NOTE — Progress Notes (Signed)
  CARE MANAGEMENT ED NOTE 03/03/2014  Patient:  Casey Vasquez,Casey Vasquez   Account Number:  [id number]  Date Initiated:  03/03/2014  Documentation initiated by:  Edd ArbourGIBBS,KIMBERLY  Subjective/Objective Assessment:   80 yr old medicare pt Vasquez/o bilateral leg pain Ultra sound shows negative for DVTs     Subjective/Objective Assessment Detail:   pcp changed from Casey Vasquez to Casey Vasquez     Action/Plan:   EPIC updated   Action/Plan Detail:   Anticipated DC Date:  03/03/2014     Status Recommendation to Physician:   Result of Recommendation:    Other ED Services  Consult Working Plan    DC Planning Services  Other  PCP issues  Outpatient Services - Pt will follow up    Choice offered to / List presented to:            Status of service:  Completed, signed off  ED Comments:   ED Comments Detail:

## 2014-03-03 NOTE — Progress Notes (Signed)
Bilateral lower extremity venous duplex:  No evidence of DVT, superficial thrombosis, or Baker's Cyst.   

## 2014-03-03 NOTE — ED Notes (Signed)
Initial Contact - pt resting on stretcher, U/S tech at bedside completing duplex at this time.  Pt c/o swelling to BLE but vague with details.  Pt reports unable to be more specific and becomes frusturated with questions.  Pt denies other complaints.  Skin PWD.  Awake, alert.  MAEI.  Speaking full/clear sentences, rr even/un-lab.  3+ BLE edema noted.  NSR on monitor.  NAD.

## 2014-03-03 NOTE — ED Notes (Signed)
Pt to CXR.

## 2014-03-03 NOTE — ED Notes (Signed)
[facility] called to provide transport back to facility.  

## 2014-03-03 NOTE — ED Notes (Signed)
Pt complains of "not feeling right" and generalized pain. Unable to specify what exactly feels wrong.

## 2014-03-03 NOTE — Discharge Instructions (Signed)

## 2014-03-03 NOTE — ED Notes (Signed)
D/c instructions reviewed with pt and daughter.  Questions answered.

## 2014-05-15 ENCOUNTER — Emergency Department (HOSPITAL_COMMUNITY)
Admission: EM | Admit: 2014-05-15 | Discharge: 2014-05-15 | Disposition: A | Discharge status: Home / Self Care | Payer: Medicare Other | Attending: Emergency Medicine | Admitting: Emergency Medicine

## 2014-05-15 ENCOUNTER — Encounter (HOSPITAL_COMMUNITY): Payer: Self-pay | Admitting: Emergency Medicine

## 2014-05-15 DIAGNOSIS — G8929 Other chronic pain: Secondary | ICD-10-CM | POA: Diagnosis not present

## 2014-05-15 DIAGNOSIS — Z043 Encounter for examination and observation following other accident: Secondary | ICD-10-CM | POA: Diagnosis not present

## 2014-05-15 DIAGNOSIS — F0390 Unspecified dementia without behavioral disturbance: Secondary | ICD-10-CM | POA: Insufficient documentation

## 2014-05-15 DIAGNOSIS — Z87440 Personal history of urinary (tract) infections: Secondary | ICD-10-CM | POA: Diagnosis not present

## 2014-05-15 DIAGNOSIS — Z880 Allergy status to penicillin: Secondary | ICD-10-CM | POA: Diagnosis not present

## 2014-05-15 DIAGNOSIS — Y9210 Unspecified residential institution as the place of occurrence of the external cause: Secondary | ICD-10-CM | POA: Diagnosis not present

## 2014-05-15 DIAGNOSIS — W07XXXA Fall from chair, initial encounter: Secondary | ICD-10-CM | POA: Diagnosis not present

## 2014-05-15 DIAGNOSIS — Z8739 Personal history of other diseases of the musculoskeletal system and connective tissue: Secondary | ICD-10-CM | POA: Diagnosis not present

## 2014-05-15 DIAGNOSIS — E039 Hypothyroidism, unspecified: Secondary | ICD-10-CM | POA: Diagnosis not present

## 2014-05-15 DIAGNOSIS — Y939 Activity, unspecified: Secondary | ICD-10-CM | POA: Insufficient documentation

## 2014-05-15 DIAGNOSIS — Z79899 Other long term (current) drug therapy: Secondary | ICD-10-CM | POA: Insufficient documentation

## 2014-05-15 DIAGNOSIS — I10 Essential (primary) hypertension: Secondary | ICD-10-CM | POA: Insufficient documentation

## 2014-05-15 DIAGNOSIS — W19XXXA Unspecified fall, initial encounter: Secondary | ICD-10-CM

## 2014-05-15 DIAGNOSIS — G589 Mononeuropathy, unspecified: Secondary | ICD-10-CM | POA: Insufficient documentation

## 2014-05-15 LAB — URINALYSIS, ROUTINE W REFLEX MICROSCOPIC
Bilirubin Urine: NEGATIVE
Glucose, UA: NEGATIVE mg/dL
Hgb urine dipstick: NEGATIVE
Ketones, ur: NEGATIVE mg/dL
Leukocytes, UA: NEGATIVE
Nitrite: NEGATIVE
Protein, ur: NEGATIVE mg/dL
Specific Gravity, Urine: 1.009 (ref 1.005–1.030)
Urobilinogen, UA: 0.2 mg/dL (ref 0.0–1.0)
pH: 7.5 (ref 5.0–8.0)

## 2014-05-15 NOTE — Discharge Instructions (Signed)

## 2014-05-15 NOTE — ED Notes (Signed)
Attempted to call report to [facility].

## 2014-05-15 NOTE — ED Notes (Signed)
Assisted patient onto bedpan. Pt was incontinent of urine. New linen and brief placed and peri-care performed.

## 2014-05-15 NOTE — ED Notes (Addendum)
[facility]. Staff reports patient fell from her chair to floor. Upon EMS arrival pt c/o back pain however, she now denies pain. HX of dementia. Pt is alert and oriented per baseline.

## 2014-05-15 NOTE — ED Notes (Signed)
ptar at bedside for transport 

## 2014-05-15 NOTE — ED Notes (Signed)
Called [facility] for transport to [facility]

## 2014-05-15 NOTE — ED Notes (Signed)
Bed: WA12 Expected date:  Expected time:  Means of arrival:  Comments: EMS elderly fall 

## 2014-05-19 NOTE — ED Provider Notes (Signed)
CSN: [id number]     Arrival date & time 05/15/14  1304 History   First MD Initiated Contact with Patient 05/15/14 1319     Chief Complaint  Patient presents with  . Fall     (Consider location/radiation/quality/duration/timing/severity/associated sxs/prior Treatment) HPI  80 year old female presenting from her purse brain stroke evaluation after a fall. Apparently fell from a chair to the floor. History dementia. She said her baseline per reports. EMS reports that she initially was complaining of some back pain. She denies this to me. She denies any pain anywhere else either. No nausea or vomiting. No use of blood thinners per review of medication list.  Past Medical History  Diagnosis Date  . HTN (hypertension)   . Dementia   . Hypothyroid   . Neuropathy   . UTI (urinary tract infection)   . Chronic pain   . Osteopenia   . Insomnia    Past Surgical History  Procedure Laterality Date  . Appendectomy    . Knee surgery    . Total hip arthroplasty     Family History  Problem Relation Age of Onset  . Hypertension    . Alzheimer's disease Mother    History  Substance Use Topics  . Smoking status: Never Smoker   . Smokeless tobacco: Never Used  . Alcohol Use: No   OB History   Grav Para Term Preterm Abortions TAB SAB Ect Mult Living                 Review of Systems  All systems reviewed and negative, other than as noted in HPI.  Allergies  Penicillins  Home Medications   Prior to Admission medications   Medication Sig Start Date End Date Taking? Authorizing Provider  acetaminophen (TYLENOL) 325 MG tablet Take 2 tablets (650 mg total) by mouth every 6 (six) hours as needed for mild pain (or Fever >/= 101). 11/19/13  Yes Christina P Rama, MD  benazepril (LOTENSIN) 40 MG tablet Take 1 tablet (40 mg total) by mouth daily. 01/12/13  Yes Mary-Margaret Daphine DeutscherMartin, FNP  calcium citrate-vitamin D (CITRACAL+D) 315-200 MG-UNIT per tablet Take 1 tablet by mouth 2 (two) times  daily.    Yes Historical Provider, MD  cholecalciferol (VITAMIN D) 1000 UNITS tablet Take 1,000 Units by mouth daily.   Yes Historical Provider, MD  diclofenac sodium (VOLTAREN) 1 % GEL Apply 2 g topically 4 (four) times daily. 08/31/13  Yes Mary-Margaret Daphine DeutscherMartin, FNP  Elastic Bandages & Supports (FUTURO FIRM COMPRESSION HOSE) MISC 1 application by Does not apply route See admin instructions. Provide and apply for eight hours then off for one hour.   Yes Historical Provider, MD  furosemide (LASIX) 40 MG tablet Take 40 mg by mouth every morning. For edema.   Yes Historical Provider, MD  gabapentin (NEURONTIN) 300 MG capsule Take 1 capsule (300 mg total) by mouth 3 (three) times daily. 08/31/13  Yes Mary-Margaret Daphine DeutscherMartin, FNP  traMADol (ULTRAM) 50 MG tablet Take 25 mg by mouth 2 (two) times daily as needed for moderate pain.   Yes Historical Provider, MD  traZODone (DESYREL) 50 MG tablet Take 1 tablet (50 mg total) by mouth at bedtime as needed for sleep. 08/31/13  Yes Mary-Margaret Daphine DeutscherMartin, FNP  levothyroxine (SYNTHROID, LEVOTHROID) 25 MCG tablet Take 1 tablet (25 mcg total) by mouth daily. 11/03/13   Mary-Margaret Daphine DeutscherMartin, FNP   BP 168/77  Pulse 61  Temp(Src) 98.1 F (36.7 C) (Oral)  Resp 16  SpO2 96% Physical Exam  Nursing note and vitals reviewed. Constitutional: She appears well-developed and well-nourished. No distress.  Laying in bed. No acute distress.  HENT:  Head: Normocephalic and atraumatic.  Eyes: Conjunctivae are normal. Right eye exhibits no discharge. Left eye exhibits no discharge.  Neck: Neck supple.  Cardiovascular: Normal rate, regular rhythm and normal heart sounds.  Exam reveals no gallop and no friction rub.   No murmur heard. Pulmonary/Chest: Effort normal and breath sounds normal. No respiratory distress.  Abdominal: Soft. She exhibits no distension. There is no tenderness.  Musculoskeletal: She exhibits no edema and no tenderness.  No midline spinal tenderness. No bony  tenderness the extremities. No apparent pain with range of motion of the large joints. No skin lesions, bruising or deformity concerning for significant acute trauma.  Neurological: She is alert.  Patient is awake and alert. She does follow commands. Answers questions although some inappropriate responses. Occasionally needs redirection. Cranial nerves are intact. Strength is 5 out of 5 bilateral upper and lower extremities. Good finger to nose testing bilaterally.  Skin: Skin is warm and dry.  Psychiatric: She has a normal mood and affect. Her behavior is normal. Thought content normal.    ED Course  Procedures (including critical care time) Labs Review Labs Reviewed  URINALYSIS, ROUTINE W REFLEX MICROSCOPIC - Abnormal; Notable for the following:    APPearance CLOUDY (*)    All other components within normal limits    Imaging Review No results found.   EKG Interpretation   Date/Time:  Saturday May 15 2014 14:12:58 EDT Ventricular Rate:  57 PR Interval:  169 QRS Duration: 92 QT Interval:  474 QTC Calculation: 461 R Axis:   36 Text Interpretation:  Sinus rhythm ED PHYSICIAN INTERPRETATION AVAILABLE  IN [facility] Confirmed by TEST, Record ([id number]) on 05/17/2014 7:50:38  AM      MDM   Final diagnoses:  Fall, initial encounter    80 year old female present for evaluation after a fall. She does have history dementia but she reports no acute pain complaints. Despite her dementia, I feel she is reliable enough to tell me if she is actually having pain or not. She has no midline spinal tenderness. No external signs of acute trauma. No bony tenderness of the extremities or apparent pain with range of motion of the large joints. She cannot tell me how or why she fell. An EKG and urinalysis was done. Fairly unremarkable. I feel she is stable for discharge at this time. Do not feel that she needs any emergent imaging or further workup.    Raeford Razor, MD 05/19/14 [phone]

## 2015-03-23 DEATH — deceased

## 2016-01-03 IMAGING — CR DG CHEST 2V
2 series · 2 of 2 positions shown · non-contrast
Comparison: PA and lateral chest x-ray dated November 16, 2013

CLINICAL DATA: Shortness of breath with left lower extremity
swelling

EXAM:
CHEST  2 VIEW

[w chest lat]
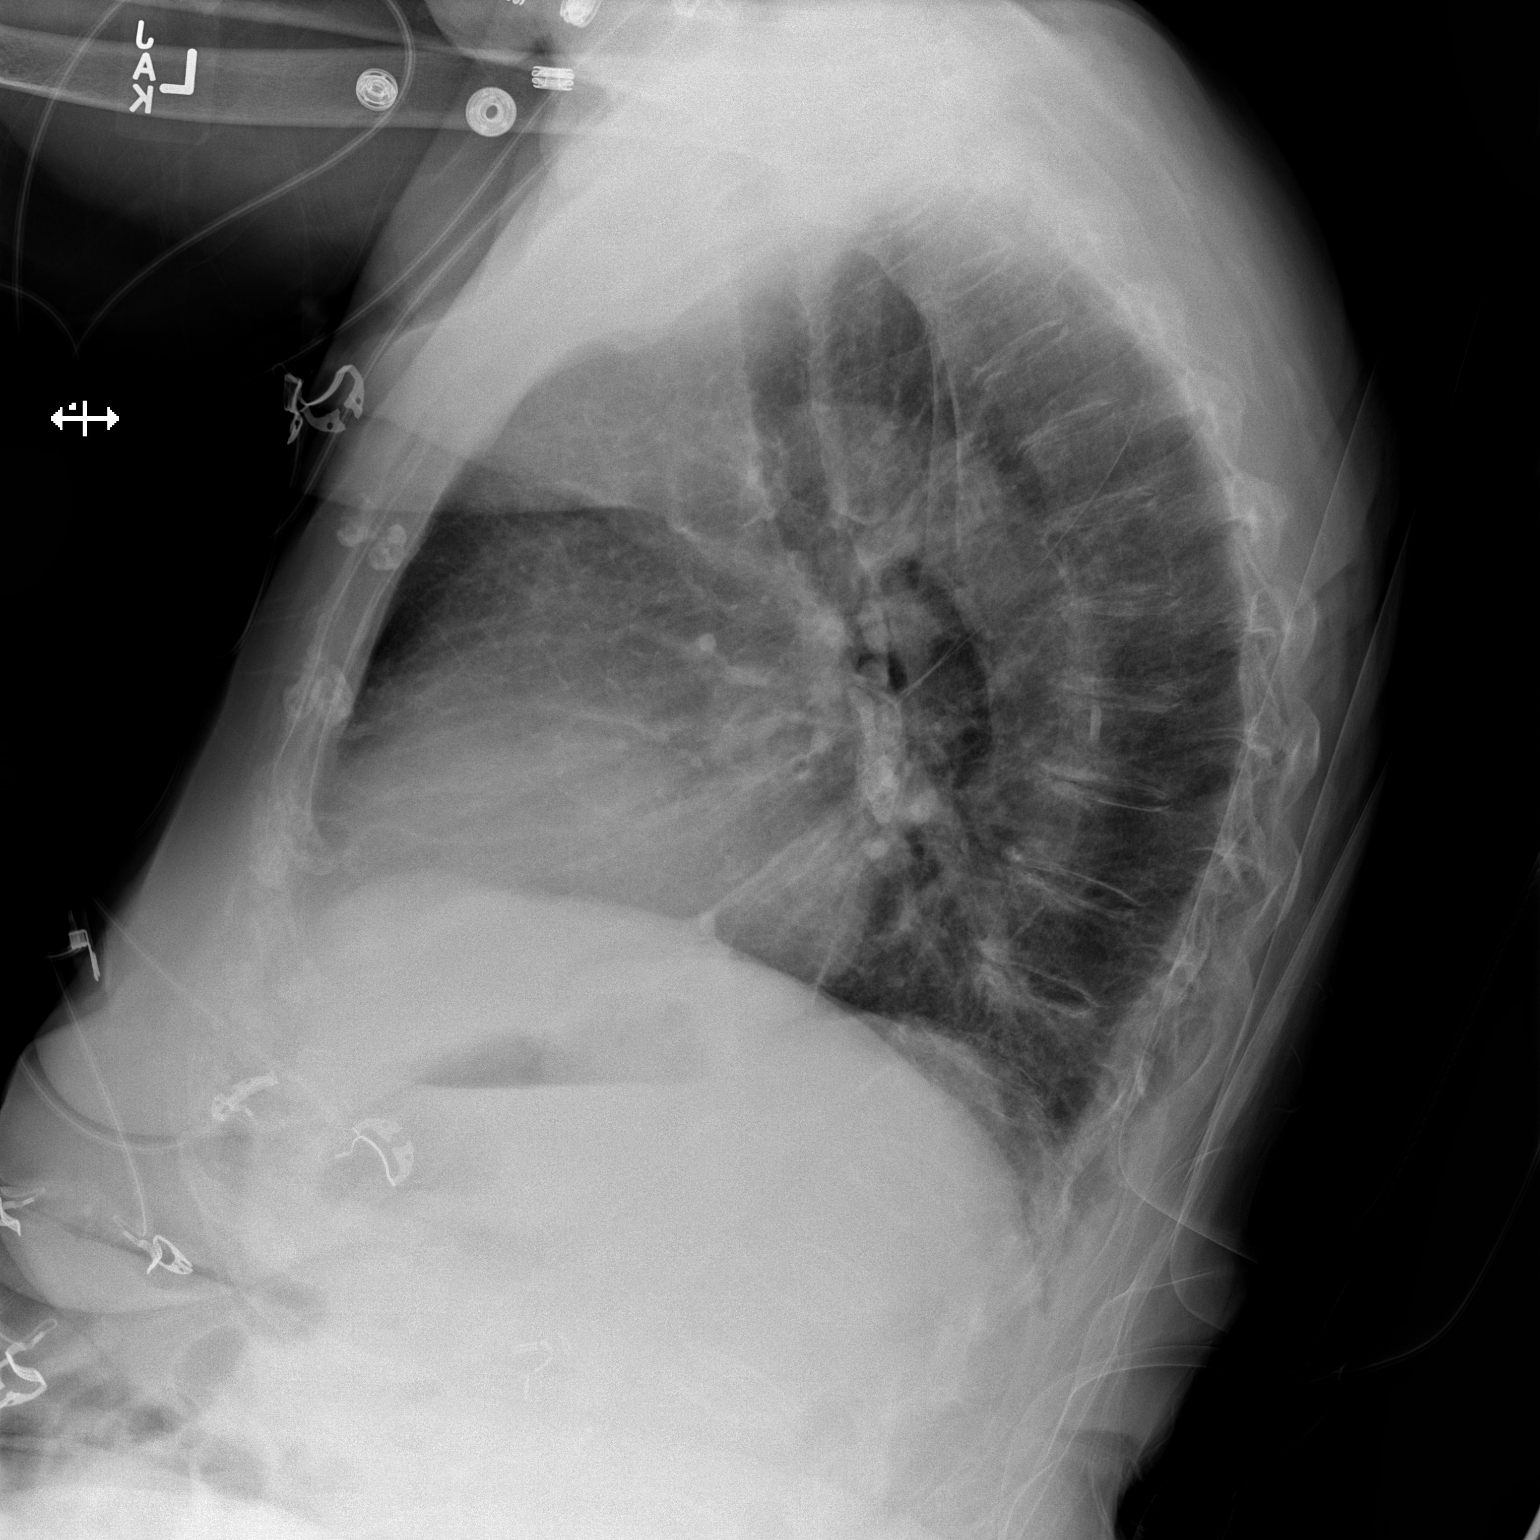

[x chest ap]
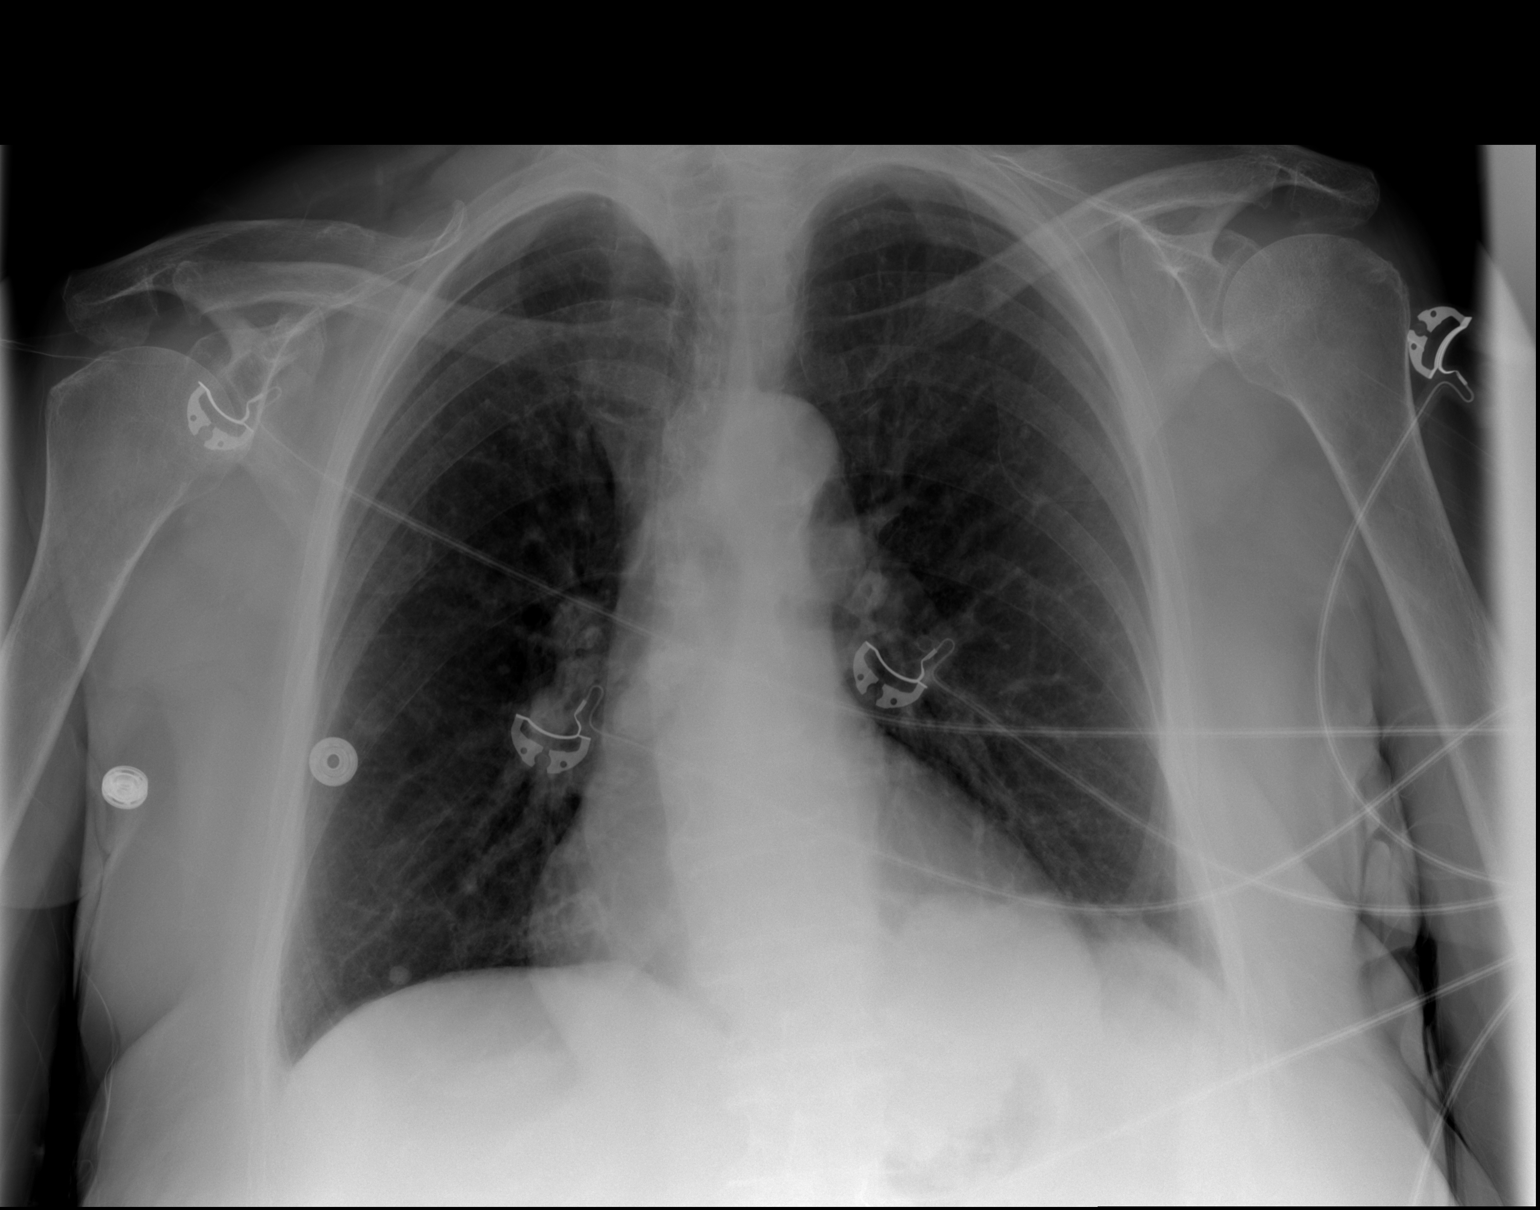

[2 of 2 positions shown; findings below may reference images not displayed]

FINDINGS: The lungs are adequately inflated. There is no focal infiltrate.
There is no pleural effusion or pneumothorax. The cardiopericardial
silhouette is top-normal in size. The pulmonary vascularity is not
engorged. The mediastinum is normal in width. The observed portions
of the bony thorax appear normal.
IMPRESSION: There is no evidence of pneumonia nor CHF nor other active
cardiopulmonary disease.
